# Patient Record
Sex: Male | Born: 1950
Health system: Southern US, Community
[De-identification: ages and names within clinical notes are randomized; demographics above are authoritative.]

## PROBLEM LIST (undated history)

## (undated) DIAGNOSIS — Z87442 Personal history of urinary calculi: Secondary | ICD-10-CM

## (undated) DIAGNOSIS — E7800 Pure hypercholesterolemia, unspecified: Secondary | ICD-10-CM

## (undated) DIAGNOSIS — N529 Male erectile dysfunction, unspecified: Secondary | ICD-10-CM

## (undated) DIAGNOSIS — I10 Essential (primary) hypertension: Secondary | ICD-10-CM

## (undated) DIAGNOSIS — R339 Retention of urine, unspecified: Secondary | ICD-10-CM

## (undated) HISTORY — DX: Male erectile dysfunction, unspecified: N52.9

## (undated) HISTORY — PX: NO PAST SURGERIES: SHX2092

## (undated) HISTORY — PX: TONSILLECTOMY: SUR1361

## (undated) HISTORY — PX: COLONOSCOPY: SHX174

## (undated) HISTORY — DX: Retention of urine, unspecified: R33.9

---

## 2014-02-17 DIAGNOSIS — M1712 Unilateral primary osteoarthritis, left knee: Secondary | ICD-10-CM | POA: Insufficient documentation

## 2014-02-17 DIAGNOSIS — M1711 Unilateral primary osteoarthritis, right knee: Secondary | ICD-10-CM | POA: Insufficient documentation

## 2014-02-25 DIAGNOSIS — I10 Essential (primary) hypertension: Secondary | ICD-10-CM | POA: Insufficient documentation

## 2017-07-09 ENCOUNTER — Encounter: Payer: Self-pay | Admitting: Emergency Medicine

## 2017-07-09 ENCOUNTER — Emergency Department
Admission: EM | Admit: 2017-07-09 | Discharge: 2017-07-09 | Disposition: A | Discharge status: Home / Self Care | Payer: Medicare Other | Attending: Emergency Medicine | Admitting: Emergency Medicine

## 2017-07-09 DIAGNOSIS — I10 Essential (primary) hypertension: Secondary | ICD-10-CM | POA: Insufficient documentation

## 2017-07-09 DIAGNOSIS — R310 Gross hematuria: Secondary | ICD-10-CM | POA: Diagnosis not present

## 2017-07-09 DIAGNOSIS — N3091 Cystitis, unspecified with hematuria: Secondary | ICD-10-CM | POA: Insufficient documentation

## 2017-07-09 DIAGNOSIS — N3001 Acute cystitis with hematuria: Secondary | ICD-10-CM

## 2017-07-09 DIAGNOSIS — R339 Retention of urine, unspecified: Secondary | ICD-10-CM | POA: Diagnosis present

## 2017-07-09 HISTORY — DX: Pure hypercholesterolemia, unspecified: E78.00

## 2017-07-09 HISTORY — DX: Essential (primary) hypertension: I10

## 2017-07-09 LAB — URINALYSIS, COMPLETE (UACMP) WITH MICROSCOPIC
Bilirubin Urine: NEGATIVE
Glucose, UA: NEGATIVE mg/dL
Ketones, ur: NEGATIVE mg/dL
Nitrite: POSITIVE — AB
Protein, ur: 100 mg/dL — AB
Specific Gravity, Urine: 1.014 (ref 1.005–1.030)
Squamous Epithelial / LPF: NONE SEEN
pH: 6 (ref 5.0–8.0)

## 2017-07-09 LAB — CBC WITH DIFFERENTIAL/PLATELET
Basophils Absolute: 0 10*3/uL (ref 0–0.1)
Basophils Relative: 0 %
Eosinophils Absolute: 0.1 10*3/uL (ref 0–0.7)
Eosinophils Relative: 0 %
HCT: 47.1 % (ref 40.0–52.0)
Hemoglobin: 16.1 g/dL (ref 13.0–18.0)
Lymphocytes Relative: 2 %
Lymphs Abs: 0.4 10*3/uL — ABNORMAL LOW (ref 1.0–3.6)
MCH: 29.3 pg (ref 26.0–34.0)
MCHC: 34.2 g/dL (ref 32.0–36.0)
MCV: 85.4 fL (ref 80.0–100.0)
Monocytes Absolute: 1 10*3/uL (ref 0.2–1.0)
Monocytes Relative: 6 %
Neutro Abs: 15.3 10*3/uL — ABNORMAL HIGH (ref 1.4–6.5)
Neutrophils Relative %: 92 %
Platelets: 210 10*3/uL (ref 150–440)
RBC: 5.52 MIL/uL (ref 4.40–5.90)
RDW: 14.2 % (ref 11.5–14.5)
WBC: 16.9 10*3/uL — ABNORMAL HIGH (ref 3.8–10.6)

## 2017-07-09 LAB — LACTIC ACID, PLASMA: Lactic Acid, Venous: 1.6 mmol/L (ref 0.5–1.9)

## 2017-07-09 LAB — BASIC METABOLIC PANEL
Anion gap: 16 — ABNORMAL HIGH (ref 5–15)
BUN: 14 mg/dL (ref 6–20)
CO2: 26 mmol/L (ref 22–32)
Calcium: 9.9 mg/dL (ref 8.9–10.3)
Chloride: 89 mmol/L — ABNORMAL LOW (ref 101–111)
Creatinine, Ser: 1.12 mg/dL (ref 0.61–1.24)
GFR calc Af Amer: >60 mL/min (ref >60)
GFR calc non Af Amer: >60 mL/min (ref >60)
Glucose, Bld: 141 mg/dL — ABNORMAL HIGH (ref 65–99)
Potassium: 3 mmol/L — ABNORMAL LOW (ref 3.5–5.1)
Sodium: 131 mmol/L — ABNORMAL LOW (ref 135–145)

## 2017-07-09 MED ORDER — TAMSULOSIN HCL 0.4 MG PO CAPS
0.4000 mg | ORAL_CAPSULE | Freq: Once | ORAL | Status: AC
  Administered 2017-07-09: 0.4 mg via ORAL
  Filled 2017-07-09: qty 1

## 2017-07-09 MED ORDER — TAMSULOSIN HCL 0.4 MG PO CAPS
ORAL_CAPSULE | ORAL | Status: AC
  Administered 2017-07-09: 0.4 mg via ORAL
  Filled 2017-07-09: qty 1

## 2017-07-09 MED ORDER — TAMSULOSIN HCL 0.4 MG PO CAPS: 0.4000 mg | ORAL_CAPSULE | Freq: Every day | ORAL | 0 refills | Status: DC

## 2017-07-09 MED ORDER — CEFTRIAXONE SODIUM IN DEXTROSE 20 MG/ML IV SOLN
1.0000 g | Freq: Once | INTRAVENOUS | Status: AC
  Administered 2017-07-09: 1 g via INTRAVENOUS
  Filled 2017-07-09: qty 50

## 2017-07-09 MED ORDER — SODIUM CHLORIDE 0.9 % IV BOLUS (SEPSIS)
500.0000 mL | Freq: Once | INTRAVENOUS | Status: AC
  Administered 2017-07-09: 500 mL via INTRAVENOUS

## 2017-07-09 MED ORDER — DEXTROSE 5 % IV SOLN: 1.0000 g | Freq: Once | INTRAVENOUS | Status: DC

## 2017-07-09 MED ORDER — CEPHALEXIN 500 MG PO CAPS: 500.0000 mg | ORAL_CAPSULE | Freq: Three times a day (TID) | ORAL | 0 refills | Status: AC

## 2017-07-09 NOTE — ED Notes (Signed)
Bladder scan completed. 969mL of urine un bladder. Minette Brine, RN notified about these findings.

## 2017-07-09 NOTE — ED Notes (Addendum)
Alert, oriented, ambulatory.   States has been on 2 rounds of antibiotics for inflammation around bladder. States was at PCP at Monday. PCP noticed blood in urine as well. Pt states has been very uncomfortable x few days. Was getting up to urinate every few minutes but unable to get much out. Was changed from cipro to doxycline this week. Pt also states fevers at home.

## 2017-07-09 NOTE — ED Provider Notes (Signed)
[facility] Emergency Department Provider Note  ____________________________________________  Time seen: Approximately 7:32 PM  I have reviewed the triage vital signs and the nursing notes.   HISTORY  Chief Complaint Urinary Retention   HPI Arthur Jones is a 66 y.o. male the history of hypertension and hyperlipidemia who presents for evaluation of urinary retention.Patient went to see his primary care doctor 2 days ago for urinary frequency, chills, low grade fever, nausea and vomiting. He was found to have a UTI. He was started on Cipro. Went back today because he was still having similar symptoms and his PCP switched him from Cipro to doxy since UA looked unchanged. This evening patient started to have urinary retention which he has never had before which prompted his visit to the ER. Patient initially had severe lower abdominal pain however that has resolved after Foley was placed in triage. Patient denies flank pain, chills, N/V or fever for the last 24 hours. Patient with hematuria in Foley bag.   Past Medical History:  Diagnosis Date  . High cholesterol   . Hypertension     There are no active problems to display for this patient.   History reviewed. No pertinent surgical history.  Prior to Admission medications   Medication Sig Start Date End Date Taking? Authorizing Provider  cephALEXin (KEFLEX) 500 MG capsule Take 1 capsule (500 mg total) 3 (three) times daily for 7 days by mouth. 07/09/17 07/16/17  Rudene Re, MD  tamsulosin (FLOMAX) 0.4 MG CAPS capsule Take 1 capsule (0.4 mg total) daily by mouth. 07/09/17   Rudene Re, MD    Allergies Patient has no known allergies.  No family history on file.  Social History Social History   Tobacco Use  . Smoking status: Never Smoker  . Smokeless tobacco: Never Used  Substance Use Topics  . Alcohol use: Yes    Comment: occasionally  . Drug use: No    Review of  Systems  Constitutional: Negative for fever. Eyes: Negative for visual changes. ENT: Negative for sore throat. Neck: No neck pain  Cardiovascular: Negative for chest pain. Respiratory: Negative for shortness of breath. Gastrointestinal: Negative for abdominal pain, vomiting or diarrhea. Genitourinary: + dysuria and frequency, + urinary retention Musculoskeletal: Negative for back pain. Skin: Negative for rash. Neurological: Negative for headaches, weakness or numbness. Psych: No SI or HI  ____________________________________________   PHYSICAL EXAM:  VITAL SIGNS: ED Triage Vitals  Enc Vitals Group     BP 07/09/17 1652 135/86     Pulse Rate 07/09/17 1652 (!) 124     Resp 07/09/17 1652 20     Temp 07/09/17 1652 98.7 F (37.1 C)     Temp Source 07/09/17 1652 Oral     SpO2 07/09/17 1652 96 %     Weight 07/09/17 1653 221 lb (100.2 kg)     Height 07/09/17 1653 5\' 11"  (1.803 m)     Head Circumference --      Peak Flow --      Pain Score 07/09/17 1716 0     Pain Loc --      Pain Edu? --      Excl. in [place]? --     Constitutional: Alert and oriented. Well appearing and in no apparent distress. HEENT:      Head: Normocephalic and atraumatic.         Eyes: Conjunctivae are normal. Sclera is non-icteric.       Mouth/Throat: Mucous membranes are moist.  Neck: Supple with no signs of meningismus. Cardiovascular: Tachycardic with regular rhythm. No murmurs, gallops, or rubs. 2+ symmetrical distal pulses are present in all extremities. No JVD. Respiratory: Normal respiratory effort. Lungs are clear to auscultation bilaterally. No wheezes, crackles, or rhonchi.  Gastrointestinal: Soft, non tender, and non distended with positive bowel sounds. No rebound or guarding. Genitourinary: No CVA tenderness. Musculoskeletal: Nontender with normal range of motion in all extremities. No edema, cyanosis, or erythema of extremities. Neurologic: Normal speech and language. Face is symmetric.  Moving all extremities. No gross focal neurologic deficits are appreciated. Skin: Skin is warm, dry and intact. No rash noted. Psychiatric: Mood and affect are normal. Speech and behavior are normal.  ____________________________________________   LABS (all labs ordered are listed, but only abnormal results are displayed)  Labs Reviewed  URINALYSIS, COMPLETE (UACMP) WITH MICROSCOPIC - Abnormal; Notable for the following components:      Result Value   Color, Urine AMBER (*)    APPearance CLOUDY (*)    Hgb urine dipstick LARGE (*)    Protein, ur 100 (*)    Nitrite POSITIVE (*)    Leukocytes, UA SMALL (*)    Bacteria, UA FEW (*)    All other components within normal limits  CBC WITH DIFFERENTIAL/PLATELET - Abnormal; Notable for the following components:   WBC 16.9 (*)    Neutro Abs 15.3 (*)    Lymphs Abs 0.4 (*)    All other components within normal limits  BASIC METABOLIC PANEL - Abnormal; Notable for the following components:   Sodium 131 (*)    Potassium 3.0 (*)    Chloride 89 (*)    Glucose, Bld 141 (*)    Anion gap 16 (*)    All other components within normal limits  URINE CULTURE  CULTURE, BLOOD (ROUTINE X 2)  CULTURE, BLOOD (ROUTINE X 2)  LACTIC ACID, PLASMA   ____________________________________________  EKG  none  ____________________________________________  RADIOLOGY  none  ____________________________________________   PROCEDURES  Procedure(s) performed: None Procedures Critical Care performed:  None ____________________________________________   INITIAL IMPRESSION / ASSESSMENT AND PLAN / ED COURSE   66 y.o. male the history of hypertension and hyperlipidemia who presents for evaluation of urinary retention in the setting of recent diagnosis of UTI. Patient with hematuria in Foley bag. Tachycardia which improved after Foley placement. No fever, normotensive. UA positive for UTI, culture will be sent. No culture done at PCP's. Will check labs to  rule out pyelo/ sepsis. Will give dose of rocephin. Will start patient on flomax.     _________________________ 10:10 PM on 07/09/2017 -----------------------------------------  Labs showing leukocytosis WBC 16.9, normal lactic acid and normal creatinine. Stable hgb. Patient was given IVF and IV rocephin. Urine and blood culture pending. Will dc home on flomax and keflex. Discussed admission for IV abx but patient wishes to go home. Therefore I recommended that he returns to the ER for abdominal pain, flank pain, fever, nausea or vomiting. Patient is extremely well appearing. F/u with PCP on Friday and Urology in one week for TOV. Foley care provided.    As part of my medical decision making, I reviewed the following data within the [place] History obtained from family, Nursing notes reviewed and incorporated, Labs reviewed , Old chart reviewed, Notes from prior ED visits and [place] Controlled Substance Database    Pertinent labs & imaging results that were available during my care of the patient were reviewed by me and considered in my  medical decision making (see chart for details).    ____________________________________________   FINAL CLINICAL IMPRESSION(S) / ED DIAGNOSES  Final diagnoses:  Urinary retention  Gross hematuria  Acute cystitis with hematuria      NEW MEDICATIONS STARTED DURING THIS VISIT:  This SmartLink is deprecated. Use AVSMEDLIST instead to display the medication list for a patient.   Note:  This document was prepared using Dragon voice recognition software and may include unintentional dictation errors.    Rudene Re, MD 07/09/17 2213

## 2017-07-09 NOTE — ED Triage Notes (Signed)
Patient presents to the ED with urinary retention since last night.  Patient states he has had dysuria and was diagnosed with a UTI on Monday and started on Cipro.  Patient's doctor changed him to doxycycline today.  Patient's urine is very bloody.  Urinary catheter placed in triage.  Patient states he had a UTI several years ago but has never seen a urologist.  Patient is in no obvious distress at this time.

## 2017-07-09 NOTE — Discharge Instructions (Signed)
RETURN TO THE ER IF YOU HAVE FEVER, CHILLS, ABDOMINAL PAIN, BACK PAIN, NAUSEA, VOMITING, OR IF YOU FEEL LIKE YOU ARE GOING TO PASS OUT. OTHERWISE FOLLOW UP WITH YOUR DOCTOR ON Friday. FOLLOW UP WITH UROLOGY IN ONE WEEK FOR REMOVAL OF THE FOLEY CATHETER. TAKE KEFLEX AS PRESCRIBED. STOP DOXYCYLINE. TAKE FLOMAX ONCE A DAY UNTIL FOLEY IS REMOVED.  Catheter care  Always wash your hands before and after touching your catheter.  Check the area around the urethra for inflammation or signs of infection. Signs of infection include irritated, swollen, red, or tender skin, or pus around the catheter.  Clean the area around the catheter with soap and water two times a day. Dry with a clean towel afterward.  Do not apply powder or lotion to the skin around the catheter.  To empty the urine collection bag  Wash your hands with soap and water.  Without touching the drain spout, remove the spout from its sleeve at the bottom of the collection bag. Open the valve on the spout.  Let the urine flow out of the bag and into the toilet or a container. Do not let the tubing or drain spout touch anything.  After you empty the bag, clean the end of the drain spout with tissue and water. Close the valve and put the drain spout back into its sleeve at the bottom of the collection bag.  Wash your hands with soap and water.

## 2017-07-12 LAB — URINE CULTURE: Culture: >=100000 — AB

## 2017-07-14 LAB — CULTURE, BLOOD (ROUTINE X 2)
Culture: NO GROWTH
Culture: NO GROWTH
Special Requests: ADEQUATE
Special Requests: ADEQUATE

## 2017-07-14 NOTE — Progress Notes (Signed)
07/15/2017 9:28 AM   Arthur Jones [birth year] [id number]  Referring provider: Derinda Late, MD [phone] [address] [facility] [place], [place] [address]  Chief Complaint  Patient presents with  . Urinary Retention    HPI: Patient is a 66 -year-old Caucasian male who presents today as a referral from [facility] ED for gross hematuria.    Patient was found to have gross hematuria on 07/09/2017 after a Foley was placed.  Patient initially went to the ED due to urinary retention.  Bladder scan noted > 900 mL in his urine.  Once Foley was placed, patient developed gross hematuria.  Patient had been on two antibiotics by his PCP for an UTI by his PCP prior to his visit to the ED. His first antibiotic was Cipro, but he did not improve.  His second antibiotic was doxycycline.  UA from November 5 was nitrate positive, 10-50 WBCs, 10-50 RBCs and many bacteria.  No culture was performed.  His second UA on November 7 was positive for 10-50 WBCs, 4-10 RBCs, moderate bacteria.  Urine culture at that time was positive for E. coli.    The E. coli was resistant to Cipro, but sensitive to the doxycycline.  In the ED, he was given Rocephin and discharged home on Keflex and Flomax.     He states that he had an UTI in May.  He was given an antibiotics and his symptoms went away.  He has a remote history of nephrolithiasis that he passed spontaneously.  He has a history of BPH, but he has not had malignancies of the genitourinary tract.   He does not have a family medical history of nephrolithiasis (maybe a sister), malignancies of the genitourinary tract or hematuria.   Today, he had a Foley in place.  Prior to the placement of the Foley, he was experiencing dysuria, intermittency and straining to urinate.  He is not experiencing any suprapubic pain, abdominal pain or flank pain. He denies any recent fevers, chills, nausea or vomiting.  He has not had any recent imaging  studies.   He is not a smoker.   He is not exposed to secondhand smoke.  He has not worked with Sports administrator, trichloroethylene, etc.   He has a high BMI.     PMH: Past Medical History:  Diagnosis Date  . Erectile dysfunction   . High cholesterol   . Hypertension   . Urinary retention     Surgical History: Past Surgical History:  Procedure Laterality Date  . NO PAST SURGERIES      Home Medications:  Allergies as of 07/15/2017   No Known Allergies     Medication List        Accurate as of 07/15/17  9:28 AM. Always use your most recent med list.          amLODipine 10 MG tablet Commonly known as:  NORVASC Take 10 mg daily by mouth.   aspirin EC 81 MG tablet Take 81 mg daily by mouth.   cephALEXin 500 MG capsule Commonly known as:  KEFLEX Take 1 capsule (500 mg total) 3 (three) times daily for 7 days by mouth.   finasteride 5 MG tablet Commonly known as:  PROSCAR Take 1 tablet (5 mg total) daily by mouth.   olmesartan-hydrochlorothiazide 40-25 MG tablet Commonly known as:  BENICAR HCT Take 1 tablet daily by mouth.   sildenafil 50 MG tablet Commonly known as:  VIAGRA Take  50 mg as needed by mouth for erectile dysfunction.   simvastatin 40 MG tablet Commonly known as:  ZOCOR Take 40 mg daily at 6 PM by mouth.   tamsulosin 0.4 MG Caps capsule Commonly known as:  FLOMAX Take 1 capsule (0.4 mg total) daily by mouth.       Allergies: No Known Allergies  Family History: Family History  Problem Relation Age of Onset  . Hypertension Father   . Hypercholesterolemia Father   . Hypertension Mother   . Hypercholesterolemia Mother     Social History:  reports that  has never smoked. he has never used smokeless tobacco. He reports that he drinks alcohol. He reports that he does not use drugs.  ROS: UROLOGY Frequent Urination?: No Hard to postpone urination?: No Burning/pain with urination?: Yes Get up at night to urinate?: No Leakage of  urine?: No Urine stream starts and stops?: No Trouble starting stream?: Yes Do you have to strain to urinate?: Yes Blood in urine?: Yes Urinary tract infection?: Yes Sexually transmitted disease?: No Injury to kidneys or bladder?: No Painful intercourse?: No Weak stream?: No Erection problems?: No Penile pain?: No  Gastrointestinal Nausea?: No Vomiting?: No Indigestion/heartburn?: No Diarrhea?: No Constipation?: No  Constitutional Fever: No Night sweats?: No Weight loss?: No Fatigue?: No  Skin Skin rash/lesions?: No Itching?: No  Eyes Blurred vision?: No Double vision?: No  Ears/Nose/Throat Sore throat?: No Sinus problems?: No  Hematologic/Lymphatic Swollen glands?: No Easy bruising?: No  Cardiovascular Leg swelling?: No Chest pain?: No  Respiratory Cough?: No Shortness of breath?: No  Endocrine Excessive thirst?: No  Musculoskeletal Back pain?: No Joint pain?: No  Neurological Headaches?: No Dizziness?: No  Psychologic Depression?: No Anxiety?: No  Physical Exam: BP 108/74   Pulse 89   Ht 5\' 11"  (1.803 m)   Wt 215 lb 3.2 oz (97.6 kg)   BMI 30.01 kg/m   Constitutional: Well nourished. Alert and oriented, No acute distress. HEENT: [place] AT, moist mucus membranes. Trachea midline, no masses. Cardiovascular: No clubbing, cyanosis, or edema. Respiratory: Normal respiratory effort, no increased work of breathing. GI: Abdomen is soft, non tender, non distended, no abdominal masses. Liver and spleen not palpable.  No hernias appreciated.  Stool sample for occult testing is not indicated.   GU: No CVA tenderness.  No bladder fullness or masses.  Patient with circumcised phallus.   Urethral meatus is patent.  No penile discharge. No penile lesions or rashes. Scrotum without lesions, cysts, rashes and/or edema.  Testicles are located scrotally bilaterally. No masses are appreciated in the testicles. Left and right epididymis are normal. Rectal:  Patient with  normal sphincter tone. Anus and perineum without scarring or rashes. No rectal masses are appreciated. Prostate is approximately 60 grams, no nodules are appreciated. Seminal vesicles are normal. Skin: No rashes, bruises or suspicious lesions. Lymph: No cervical or inguinal adenopathy. Neurologic: Grossly intact, no focal deficits, moving all 4 extremities. Psychiatric: Normal mood and affect.  Laboratory Data: PSA History   1.17 in January 2016  1.01 in March 2017  1.16 in April 2018 Lab Results  Component Value Date   WBC 16.9 (H) 07/09/2017   HGB 16.1 07/09/2017   HCT 47.1 07/09/2017   MCV 85.4 07/09/2017   PLT 210 07/09/2017    Lab Results  Component Value Date   CREATININE 1.12 07/09/2017     Urinalysis    Component Value Date/Time   COLORURINE AMBER (A) 07/09/2017 1709   APPEARANCEUR CLOUDY (A) 07/09/2017 1709  LABSPEC 1.014 07/09/2017 1709   PHURINE 6.0 07/09/2017 1709   GLUCOSEU NEGATIVE 07/09/2017 1709   HGBUR LARGE (A) 07/09/2017 1709   BILIRUBINUR NEGATIVE 07/09/2017 1709   KETONESUR NEGATIVE 07/09/2017 1709   PROTEINUR 100 (A) 07/09/2017 1709   NITRITE POSITIVE (A) 07/09/2017 1709   LEUKOCYTESUR SMALL (A) 07/09/2017 1709   I have reviewed the labs.   Assessment & Plan:    1. Gross hematuria  - I explained to the patient that there are a number of causes that can be associated with blood in the urine, such as stones, BPH, UTI's, damage to the urinary tract and/or cancer -his recent episode of gross hematuria is most likely due to the results of urinary tract infection and urinary retention we will continue to monitor once these issues are resolved to see if the hematuria persists  -If the hematuria persists, the patient will warrant further urologic evaluation.   The AUA guidelines state that a CT urogram is the preferred imaging study and a cystoscopy is indicated to evaluate hematuria.   2. Acute urinary retention:     - foley catheter  removed for TOV  -continue the tamsulosin 0.4 mg daily; refills given    - add finasteride 5 mg daily; side effects discussed; prescription given  -return if unable to urinate or experiencing suprapubic discomfort  -follow-up in one month for I PSS score, PVR and exam.  3.  UTI with hematuria  - Patient will continue the Keflex  - Advised to contact our office or seek treatment in the ED if becomes febrile or pain/ vomiting are difficult control in order to arrange for emergent/urgent intervention  4. BPH with LU TS  - see above   Return in about 1 month (around 08/14/2017) for IPSS and PVR.  These notes generated with voice recognition software. I apologize for typographical errors.  Zara Council, [facility] [address] [place], [place] [address] [phone]

## 2017-07-15 ENCOUNTER — Ambulatory Visit: Payer: Medicare Other | Admitting: Urology

## 2017-07-15 ENCOUNTER — Encounter: Payer: Self-pay | Admitting: Urology

## 2017-07-15 VITALS — BP 108/74 | HR 89 | Ht 71.0 in | Wt 215.2 lb

## 2017-07-15 DIAGNOSIS — R338 Other retention of urine: Secondary | ICD-10-CM

## 2017-07-15 DIAGNOSIS — R310 Gross hematuria: Secondary | ICD-10-CM | POA: Diagnosis not present

## 2017-07-15 DIAGNOSIS — N401 Enlarged prostate with lower urinary tract symptoms: Secondary | ICD-10-CM

## 2017-07-15 DIAGNOSIS — N529 Male erectile dysfunction, unspecified: Secondary | ICD-10-CM | POA: Insufficient documentation

## 2017-07-15 DIAGNOSIS — N138 Other obstructive and reflux uropathy: Secondary | ICD-10-CM

## 2017-07-15 DIAGNOSIS — N3001 Acute cystitis with hematuria: Secondary | ICD-10-CM

## 2017-07-15 MED ORDER — FINASTERIDE 5 MG PO TABS: 5.0000 mg | ORAL_TABLET | Freq: Every day | ORAL | 3 refills | Status: DC

## 2017-07-15 MED ORDER — TAMSULOSIN HCL 0.4 MG PO CAPS: 0.4000 mg | ORAL_CAPSULE | Freq: Every day | ORAL | 3 refills | Status: DC

## 2017-07-15 NOTE — Progress Notes (Signed)
Catheter Removal  Patient is present today for a catheter removal.  75ml of water was drained from the balloon. A 16FR foley cath was removed from the bladder no complications were noted . Patient tolerated well.  Performed by: Reece Packer, RN

## 2017-08-11 NOTE — Progress Notes (Deleted)
08/12/2017 10:36 AM   Arthur Jones Mar 24, 1951 [id number]  Referring provider: Derinda Late, MD [phone] [facility] [place], [place] [address]  No chief complaint on file.   HPI: 66 yo WM with gross hematuria associated with an UTI, urinary retention and BPH with LU TS who presents for a one month follow up.  Background history Patient is a 53 -year-old Caucasian male who presents today as [name] for gross hematuria.  Patient was found to have gross hematuria on 07/09/2017 after a Foley was placed.  Patient initially went to the ED due to urinary retention.  Bladder scan noted > 900 mL in his urine.  Once Foley was placed, patient developed gross hematuria.  Patient had been on two antibiotics by his PCP for an UTI by his PCP prior to his visit to the ED. His first antibiotic was Cipro, but he did not improve.  His second antibiotic was doxycycline.  UA from November 5 was nitrate positive, 10-50 WBCs, 10-50 RBCs and many bacteria.  No culture was performed.  His second UA on November 7 was positive for 10-50 WBCs, 4-10 RBCs, moderate bacteria.  Urine culture at that time was positive for E. coli.    The E. coli was resistant to Cipro, but sensitive to the doxycycline.  In the ED, he was given Rocephin and discharged home on Keflex and Flomax.   He states that he had an UTI in May.  He was given an antibiotics and his symptoms went away.  He has a remote history of nephrolithiasis that he passed spontaneously.  He has a history of BPH, but he has not had malignancies of the genitourinary tract.  He does not have a family medical history of nephrolithiasis (maybe a sister), malignancies of the genitourinary tract or hematuria.     At his visit on 07/15/2017, he had a Foley in place.  Prior to the placement of the Foley, he was experiencing dysuria, intermittency and straining to urinate.  He was not experiencing any suprapubic  pain, abdominal pain or flank pain. He denied any recent fevers, chills, nausea or vomiting.  He has not had any recent imaging studies.  He is not a smoker.   He is not exposed to secondhand smoke.  He has not worked with Sports administrator, trichloroethylene, etc.   He has a high BMI.  His Foley was removed for a TOV.  He was instructed to continue the tamsulosin 0.4 mg and finasteride 5 mg was added.    His IPSS score today is ***, which is *** lower urinary tract symptomatology. He is *** with his quality life due to his urinary symptoms. His PVR is *** mL.  His UA today is ***.    His major complaint today ***.  He has had these symptoms for *** years.  He denies any dysuria, hematuria or suprapubic pain.   He currently taking ***.  His has had ***.    He also denies any recent fevers, chills, nausea or vomiting.  He does not have a family history of PCa.    Score:  1-7 Mild 8-19 Moderate 20-35 Severe    PMH: Past Medical History:  Diagnosis Date  . Erectile dysfunction   . High cholesterol   . Hypertension   . Urinary retention     Surgical History: Past Surgical History:  Procedure Laterality Date  . NO PAST SURGERIES  Home Medications:  Allergies as of 08/12/2017   No Known Allergies     Medication List        Accurate as of 08/11/17 10:36 AM. Always use your most recent med list.          amLODipine 10 MG tablet Commonly known as:  NORVASC Take 10 mg daily by mouth.   aspirin EC 81 MG tablet Take 81 mg daily by mouth.   finasteride 5 MG tablet Commonly known as:  PROSCAR Take 1 tablet (5 mg total) daily by mouth.   olmesartan-hydrochlorothiazide 40-25 MG tablet Commonly known as:  BENICAR HCT Take 1 tablet daily by mouth.   sildenafil 50 MG tablet Commonly known as:  VIAGRA Take 50 mg as needed by mouth for erectile dysfunction.   simvastatin 40 MG tablet Commonly known as:  ZOCOR Take 40 mg daily at 6 PM by mouth.   tamsulosin  0.4 MG Caps capsule Commonly known as:  FLOMAX Take 1 capsule (0.4 mg total) daily by mouth.       Allergies: No Known Allergies  Family History: Family History  Problem Relation Age of Onset  . Hypertension Father   . Hypercholesterolemia Father   . Hypertension Mother   . Hypercholesterolemia Mother     Social History:  reports that  has never smoked. he has never used smokeless tobacco. He reports that he drinks alcohol. He reports that he does not use drugs.  ROS:                                        Physical Exam: There were no vitals taken for this visit.  Constitutional: Well nourished. Alert and oriented, No acute distress. HEENT: [place] AT, moist mucus membranes. Trachea midline, no masses. Cardiovascular: No clubbing, cyanosis, or edema. Respiratory: Normal respiratory effort, no increased work of breathing. Skin: No rashes, bruises or suspicious lesions. Lymph: No cervical or inguinal adenopathy. Neurologic: Grossly intact, no focal deficits, moving all 4 extremities. Psychiatric: Normal mood and affect.  Laboratory Data: PSA History   1.17 in January 2016  1.01 in March 2017  1.16 in April 2018 Lab Results  Component Value Date   WBC 16.9 (H) 07/09/2017   HGB 16.1 07/09/2017   HCT 47.1 07/09/2017   MCV 85.4 07/09/2017   PLT 210 07/09/2017    Lab Results  Component Value Date   CREATININE 1.12 07/09/2017     Urinalysis *** I have reviewed the labs.   Assessment & Plan:    1. Gross hematuria  - I explained to the patient that there are a number of causes that can be associated with blood in the urine, such as stones, BPH, UTI's, damage to the urinary tract and/or cancer -his recent episode of gross hematuria is most likely due to the results of urinary tract infection and urinary retention we will continue to monitor once these issues are resolved to see if the hematuria persists  -If the hematuria persists, the patient  will warrant further urologic evaluation.   The AUA guidelines state that a CT urogram is the preferred imaging study and a cystoscopy is indicated to evaluate hematuria.   2. Acute urinary retention:     - foley catheter removed for TOV  -continue the tamsulosin 0.4 mg daily; refills given    - add finasteride 5 mg daily; side effects discussed; prescription given  -return  if unable to urinate or experiencing suprapubic discomfort  -follow-up in one month for I PSS score, PVR and exam.  3.  UTI with hematuria  - Patient will continue the Keflex  - Advised to contact our office or seek treatment in the ED if becomes febrile or pain/ vomiting are difficult control in order to arrange for emergent/urgent intervention  4. BPH with LU TS  - see above   No Follow-up on file.  These notes generated with voice recognition software. I apologize for typographical errors.  Zara Council, [facility] [address] [place], [place] [address] [phone]

## 2017-08-12 ENCOUNTER — Ambulatory Visit: Payer: Medicare Other | Admitting: Urology

## 2017-08-12 ENCOUNTER — Encounter: Payer: Self-pay | Admitting: Urology

## 2017-08-12 VITALS — BP 159/95 | HR 88 | Ht 71.0 in | Wt 220.0 lb

## 2017-08-12 DIAGNOSIS — N3001 Acute cystitis with hematuria: Secondary | ICD-10-CM | POA: Diagnosis not present

## 2017-08-12 DIAGNOSIS — N138 Other obstructive and reflux uropathy: Secondary | ICD-10-CM | POA: Diagnosis not present

## 2017-08-12 DIAGNOSIS — R310 Gross hematuria: Secondary | ICD-10-CM | POA: Diagnosis not present

## 2017-08-12 DIAGNOSIS — R339 Retention of urine, unspecified: Secondary | ICD-10-CM

## 2017-08-12 DIAGNOSIS — N401 Enlarged prostate with lower urinary tract symptoms: Secondary | ICD-10-CM | POA: Diagnosis not present

## 2017-08-12 LAB — URINALYSIS, COMPLETE
Bilirubin, UA: NEGATIVE
Glucose, UA: NEGATIVE
Ketones, UA: NEGATIVE
Nitrite, UA: POSITIVE — AB
Specific Gravity, UA: 1.025 (ref 1.005–1.030)
Urobilinogen, Ur: 0.2 mg/dL (ref 0.2–1.0)
pH, UA: 6 (ref 5.0–7.5)

## 2017-08-12 LAB — MICROSCOPIC EXAMINATION
Epithelial Cells (non renal): NONE SEEN /hpf (ref 0–10)
WBC, UA: >30 /hpf — ABNORMAL HIGH (ref 0–?)

## 2017-08-12 LAB — BLADDER SCAN AMB NON-IMAGING: Scan Result: 289

## 2017-08-12 NOTE — Progress Notes (Signed)
08/12/2017 11:11 AM   Arthur Jones 1950/10/04 [id number]  Referring provider: Derinda Late, MD [phone] [address] [facility] [place] [facility] [place], [place] [address]  Chief Complaint  Patient presents with  . Urinary Retention  . Hematuria    HPI: 66 yo WM with gross hematuria associated with an UTI, urinary retention and BPH with LU TS who presents for a one month follow up.  Background history Patient is a 80 -year-old Caucasian male who presents today as a referral from [facility] ED for gross hematuria.  Patient was found to have gross hematuria on 07/09/2017 after a Foley was placed.  Patient initially went to the ED due to urinary retention.  Bladder scan noted > 900 mL in his urine.  Once Foley was placed, patient developed gross hematuria.  Patient had been on two antibiotics by his PCP for an UTI by his PCP prior to his visit to the ED. His first antibiotic was Cipro, but he did not improve.  His second antibiotic was doxycycline.  UA from November 5 was nitrate positive, 10-50 WBCs, 10-50 RBCs and many bacteria.  No culture was performed.  His second UA on November 7 was positive for 10-50 WBCs, 4-10 RBCs, moderate bacteria.  Urine culture at that time was positive for E. coli.    The E. coli was resistant to Cipro, but sensitive to the doxycycline.  In the ED, he was given Rocephin and discharged home on Keflex and Flomax.   He states that he had an UTI in May.  He was given an antibiotics and his symptoms went away.  He has a remote history of nephrolithiasis that he passed spontaneously.  He has a history of BPH, but he has not had malignancies of the genitourinary tract.  He does not have a family medical history of nephrolithiasis (maybe a sister), malignancies of the genitourinary tract or hematuria.     At his visit on 07/15/2017, he had a Foley in place.  Prior to the placement of the Foley, he was experiencing dysuria, intermittency and straining to  urinate.  He was not experiencing any suprapubic pain, abdominal pain or flank pain. He denied any recent fevers, chills, nausea or vomiting.  He has not had any recent imaging studies.  He is not a smoker.   He is not exposed to secondhand smoke.  He has not worked with Sports administrator, trichloroethylene, etc.   He has a high BMI.  His Foley was removed for a TOV.  He was instructed to continue the tamsulosin 0.4 mg and finasteride 5 mg was added.    His IPSS score today is 4, which is mild lower urinary tract symptomatology. He is mostly satisfied with his quality life due to his urinary symptoms. His PVR is 289 mL.  His UA today is positive for > 30 WBC's, 3-10 RBC's, many bacteria and nitrite positive.    He has no complaints today.   He denies any dysuria, hematuria or suprapubic pain.   He currently taking tamsulosin 0.4 mg and finasteride 5 mg daily.    He also denies any recent fevers, chills, nausea or vomiting.  He does not have a family history of PCa.  IPSS    Row Name 08/12/17 1000         International Prostate Symptom Score   How often have you had the sensation of not emptying your bladder?  Less than 1 in 5  How often have you had to urinate less than every two hours?  Less than 1 in 5 times     How often have you found you stopped and started again several times when you urinated?  Not at All     How often have you found it difficult to postpone urination?  Not at All     How often have you had a weak urinary stream?  Not at All     How often have you had to strain to start urination?  Not at All     How many times did you typically get up at night to urinate?  2 Times     Total IPSS Score  4       Quality of Life due to urinary symptoms   If you were to spend the rest of your life with your urinary condition just the way it is now how would you feel about that?  Mostly Satisfied        Score:  1-7 Mild 8-19 Moderate 20-35 Severe    PMH: Past Medical  History:  Diagnosis Date  . Erectile dysfunction   . High cholesterol   . Hypertension   . Urinary retention     Surgical History: Past Surgical History:  Procedure Laterality Date  . NO PAST SURGERIES      Home Medications:  Allergies as of 08/12/2017   No Known Allergies     Medication List        Accurate as of 08/12/17 11:11 AM. Always use your most recent med list.          amLODipine 10 MG tablet Commonly known as:  NORVASC Take 10 mg daily by mouth.   aspirin EC 81 MG tablet Take 81 mg daily by mouth.   finasteride 5 MG tablet Commonly known as:  PROSCAR Take 1 tablet (5 mg total) daily by mouth.   olmesartan-hydrochlorothiazide 40-25 MG tablet Commonly known as:  BENICAR HCT Take 1 tablet daily by mouth.   sildenafil 50 MG tablet Commonly known as:  VIAGRA Take 50 mg as needed by mouth for erectile dysfunction.   simvastatin 40 MG tablet Commonly known as:  ZOCOR Take 40 mg daily at 6 PM by mouth.   tamsulosin 0.4 MG Caps capsule Commonly known as:  FLOMAX Take 1 capsule (0.4 mg total) daily by mouth.       Allergies: No Known Allergies  Family History: Family History  Problem Relation Age of Onset  . Hypertension Father   . Hypercholesterolemia Father   . Hypertension Mother   . Hypercholesterolemia Mother     Social History:  reports that  has never smoked. he has never used smokeless tobacco. He reports that he drinks alcohol. He reports that he does not use drugs.  ROS: UROLOGY Frequent Urination?: No Hard to postpone urination?: No Burning/pain with urination?: No Get up at night to urinate?: No Leakage of urine?: No Urine stream starts and stops?: No Trouble starting stream?: No Do you have to strain to urinate?: No Blood in urine?: No Urinary tract infection?: No Sexually transmitted disease?: No Injury to kidneys or bladder?: No Painful intercourse?: No Weak stream?: No Erection problems?: No Penile pain?:  No  Gastrointestinal Nausea?: No Vomiting?: No Indigestion/heartburn?: No Diarrhea?: No Constipation?: No  Constitutional Fever: No Night sweats?: No Weight loss?: No Fatigue?: No  Skin Skin rash/lesions?: No Itching?: No  Eyes Blurred vision?: No Double vision?: No  Ears/Nose/Throat Sore throat?:  No Sinus problems?: No  Hematologic/Lymphatic Swollen glands?: No Easy bruising?: No  Cardiovascular Leg swelling?: No Chest pain?: No  Respiratory Cough?: No Shortness of breath?: No  Endocrine Excessive thirst?: No  Musculoskeletal Back pain?: No Joint pain?: No  Neurological Headaches?: No Dizziness?: No  Psychologic Depression?: No Anxiety?: No  Physical Exam: BP (!) 159/95   Pulse 88   Ht 5\' 11"  (1.803 m)   Wt 220 lb (99.8 kg)   BMI 30.68 kg/m   Constitutional: Well nourished. Alert and oriented, No acute distress. HEENT: [place] AT, moist mucus membranes. Trachea midline, no masses. Cardiovascular: No clubbing, cyanosis, or edema. Respiratory: Normal respiratory effort, no increased work of breathing. Skin: No rashes, bruises or suspicious lesions. Lymph: No cervical or inguinal adenopathy. Neurologic: Grossly intact, no focal deficits, moving all 4 extremities. Psychiatric: Normal mood and affect.  Laboratory Data: PSA History   1.17 in January 2016  1.01 in March 2017  1.16 in April 2018 Lab Results  Component Value Date   WBC 16.9 (H) 07/09/2017   HGB 16.1 07/09/2017   HCT 47.1 07/09/2017   MCV 85.4 07/09/2017   PLT 210 07/09/2017    Lab Results  Component Value Date   CREATININE 1.12 07/09/2017     Urinalysis > 30 WBC's, 3-10 RBC's, many bacteria and nitrite positive.   See Epic.   I have reviewed the labs.   Assessment & Plan:    1. Gross hematuria  - I explained to the patient that there are a number of causes that can be associated with blood in the urine, such as stones, BPH, UTI's, damage to the urinary tract  and/or cancer -his recent episode of gross hematuria is most likely due to the results of urinary tract infection and urinary retention we will continue to monitor once these issues are resolved to see if the hematuria persists  -If the hematuria persists, the patient will warrant further urologic evaluation.   The AUA guidelines state that a CT urogram is the preferred imaging study and a cystoscopy is indicated to evaluate hematuria.  - UA is positive for hematuria - but it is also suspicious for infection   2. History of urinary retention:     -continue the tamsulosin 0.4 mg daily and finasteride 5 mg daily  -return if unable to urinate or experiencing suprapubic discomfort  -follow-up in three month for I PSS score, PVR and exam.  3.  UTI with hematuria  - UA is still suspicious for UTI - patient asymptomatic - will send for culture - will not prescribe an antibiotic at this time - wait on ucx results  - Advised to contact our office or seek treatment in the ED if becomes febrile or pain/ vomiting are difficult control in order to arrange for emergent/urgent intervention  4. BPH with LU TS  - see above   Return in about 3 months (around 11/10/2017) for IPSS, PVR and exam.  These notes generated with voice recognition software. I apologize for typographical errors.  Zara Council, [facility] [address] [place], [place] [address] [phone]

## 2017-08-15 LAB — CULTURE, URINE COMPREHENSIVE

## 2017-08-18 ENCOUNTER — Telehealth: Payer: Self-pay

## 2017-08-18 MED ORDER — AMOXICILLIN-POT CLAVULANATE 875-125 MG PO TABS: 1.0000 | ORAL_TABLET | Freq: Two times a day (BID) | ORAL | 0 refills | Status: AC

## 2017-08-18 NOTE — Telephone Encounter (Signed)
Spoke with pt in reference to +ucx, abx and hematuria. Pt voiced understanding. Abx sent to pharmacy.

## 2017-08-18 NOTE — Telephone Encounter (Signed)
-----   Message from Nori Riis, PA-C sent at 08/17/2017  4:44 PM EST ----- Please let [name] know that his urine culture was positive.  We need to start Augmentin 875/125, one tablet twice daily x seven days.  We then need to recheck his urine to make sure the blood is no longer present.

## 2017-11-10 ENCOUNTER — Ambulatory Visit: Payer: Medicare Other | Admitting: Urology

## 2017-11-11 ENCOUNTER — Ambulatory Visit: Payer: Medicare Other | Admitting: Urology

## 2017-11-11 ENCOUNTER — Encounter: Payer: Self-pay | Admitting: Urology

## 2017-11-11 VITALS — BP 147/95 | HR 79 | Resp 16 | Ht 71.0 in | Wt 221.0 lb

## 2017-11-11 DIAGNOSIS — Z87898 Personal history of other specified conditions: Secondary | ICD-10-CM | POA: Diagnosis not present

## 2017-11-11 DIAGNOSIS — Z87448 Personal history of other diseases of urinary system: Secondary | ICD-10-CM

## 2017-11-11 DIAGNOSIS — N401 Enlarged prostate with lower urinary tract symptoms: Secondary | ICD-10-CM

## 2017-11-11 DIAGNOSIS — N138 Other obstructive and reflux uropathy: Secondary | ICD-10-CM

## 2017-11-11 LAB — URINALYSIS, COMPLETE
Bilirubin, UA: NEGATIVE
Glucose, UA: NEGATIVE
Ketones, UA: NEGATIVE
Leukocytes, UA: NEGATIVE
Nitrite, UA: NEGATIVE
Protein, UA: NEGATIVE
RBC, UA: NEGATIVE
Specific Gravity, UA: 1.02 (ref 1.005–1.030)
Urobilinogen, Ur: 0.2 mg/dL (ref 0.2–1.0)
pH, UA: 6.5 (ref 5.0–7.5)

## 2017-11-11 NOTE — Progress Notes (Signed)
11/11/2017 11:02 PM   Arthur Jones 08-09-1951 [id number]  Referring provider: Derinda Late, MD [phone] [address] [facility] [place], [place] [address]  No chief complaint on file.   HPI: 67 yo WM with a history of gross hematuria associated with an UTI, urinary retention and BPH with LU TS who presents for a three month follow up.  Background history Patient is a 67 -year-old Caucasian male who presents today as a referral from [facility] ED for gross hematuria.  Patient was found to have gross hematuria on 07/09/2017 after a Foley was placed.  Patient initially went to the ED due to urinary retention.  Bladder scan noted > 900 mL in his urine.  Once Foley was placed, patient developed gross hematuria.  Patient had been on two antibiotics by his PCP for an UTI by his PCP prior to his visit to the ED. His first antibiotic was Cipro, but he did not improve.  His second antibiotic was doxycycline.  UA from November 5 was nitrate positive, 10-50 WBCs, 10-50 RBCs and many bacteria.  No culture was performed.  His second UA on November 7 was positive for 10-50 WBCs, 4-10 RBCs, moderate bacteria.  Urine culture at that time was positive for E. coli.    The E. coli was resistant to Cipro, but sensitive to the doxycycline.  In the ED, he was given Rocephin and discharged home on Keflex and Flomax.   He states that he had an UTI in May.  He was given an antibiotics and his symptoms went away.  He has a remote history of nephrolithiasis that he passed spontaneously.  He has a history of BPH, but he has not had malignancies of the genitourinary tract.  He does not have a family medical history of nephrolithiasis (maybe a sister), malignancies of the genitourinary tract or hematuria.    At his visit on 07/15/2017, he had a Foley in place.  Prior to the placement of the Foley, he was experiencing dysuria, intermittency and straining to urinate.  He was not experiencing  any suprapubic pain, abdominal pain or flank pain. He denied any recent fevers, chills, nausea or vomiting.  He has not had any recent imaging studies.  He is not a smoker.   He is not exposed to secondhand smoke.  He has not worked with Sports administrator, trichloroethylene, etc.   He has a high BMI.  His Foley was removed for a TOV.  He was instructed to continue the tamsulosin 0.4 mg and finasteride 5 mg was added.    His IPSS score today is 1, which is mild lower urinary tract symptomatology. He is pleased with his quality life due to his urinary symptoms.   His previous I PSS score was 4/2.  His previous PVR was 289 mL.  His UA today is negative.    He has no complaints today.   He denies any dysuria, hematuria or suprapubic pain.   He currently taking tamsulosin 0.4 mg and finasteride 5 mg daily.    He also denies any recent fevers, chills, nausea or vomiting.  He does not have a family history of PCa.  IPSS    Row Name 11/11/17 0900         International Prostate Symptom Score   How often have you had the sensation of not emptying your bladder?  Not at All     How often have you had to urinate less than  every two hours?  Not at All     How often have you found you stopped and started again several times when you urinated?  Not at All     How often have you found it difficult to postpone urination?  Not at All     How often have you had a weak urinary stream?  Not at All     How often have you had to strain to start urination?  Not at All     How many times did you typically get up at night to urinate?  1 Time     Total IPSS Score  1       Quality of Life due to urinary symptoms   If you were to spend the rest of your life with your urinary condition just the way it is now how would you feel about that?  Pleased        Score:  1-7 Mild 8-19 Moderate 20-35 Severe    PMH: Past Medical History:  Diagnosis Date  . Erectile dysfunction   . High cholesterol   .  Hypertension   . Urinary retention     Surgical History: Past Surgical History:  Procedure Laterality Date  . NO PAST SURGERIES      Home Medications:  Allergies as of 11/11/2017   No Known Allergies     Medication List        Accurate as of 11/11/17 11:59 PM. Always use your most recent med list.          amLODipine 10 MG tablet Commonly known as:  NORVASC Take 10 mg daily by mouth.   aspirin EC 81 MG tablet Take 81 mg daily by mouth.   finasteride 5 MG tablet Commonly known as:  PROSCAR Take 1 tablet (5 mg total) daily by mouth.   olmesartan-hydrochlorothiazide 40-25 MG tablet Commonly known as:  BENICAR HCT Take 1 tablet daily by mouth.   sildenafil 50 MG tablet Commonly known as:  VIAGRA Take 50 mg as needed by mouth for erectile dysfunction.   simvastatin 40 MG tablet Commonly known as:  ZOCOR Take 40 mg daily at 6 PM by mouth.   tamsulosin 0.4 MG Caps capsule Commonly known as:  FLOMAX Take 1 capsule (0.4 mg total) daily by mouth.       Allergies: No Known Allergies  Family History: Family History  Problem Relation Age of Onset  . Hypertension Father   . Hypercholesterolemia Father   . Hypertension Mother   . Hypercholesterolemia Mother     Social History:  reports that he has never smoked. He has never used smokeless tobacco. He reports that he drinks alcohol. He reports that he does not use drugs.  ROS: UROLOGY Frequent Urination?: No Hard to postpone urination?: No Burning/pain with urination?: No Get up at night to urinate?: No Leakage of urine?: No Urine stream starts and stops?: No Trouble starting stream?: No Do you have to strain to urinate?: No Blood in urine?: No Urinary tract infection?: No Sexually transmitted disease?: No Injury to kidneys or bladder?: No Painful intercourse?: No Weak stream?: No Erection problems?: No Penile pain?: No  Gastrointestinal Nausea?: No Vomiting?: No Indigestion/heartburn?:  No Diarrhea?: No Constipation?: No  Constitutional Fever: No Night sweats?: No Weight loss?: No Fatigue?: No  Skin Skin rash/lesions?: No Itching?: No  Eyes Blurred vision?: No Double vision?: No  Ears/Nose/Throat Sore throat?: No Sinus problems?: No  Hematologic/Lymphatic Swollen glands?: No Easy bruising?: No  Cardiovascular Leg swelling?: No Chest pain?: No  Respiratory Cough?: No Shortness of breath?: No  Endocrine Excessive thirst?: No  Musculoskeletal Back pain?: No Joint pain?: No  Neurological Headaches?: No Dizziness?: No  Psychologic Depression?: No Anxiety?: No  Physical Exam: BP (!) 147/95   Pulse 79   Resp 16   Ht 5\' 11"  (1.803 m)   Wt 221 lb (100.2 kg)   SpO2 98%   BMI 30.82 kg/m   Constitutional: Well nourished. Alert and oriented, No acute distress. HEENT: [place] AT, moist mucus membranes. Trachea midline, no masses. Cardiovascular: No clubbing, cyanosis, or edema. Respiratory: Normal respiratory effort, no increased work of breathing. GI: Abdomen is soft, non tender, non distended, no abdominal masses. Liver and spleen not palpable.  No hernias appreciated.  Stool sample for occult testing is not indicated.   GU: No CVA tenderness.  No bladder fullness or masses.  Patient with circumcised phallus.   Urethral meatus is patent.  No penile discharge. No penile lesions or rashes. Scrotum without lesions, cysts, rashes and/or edema.  Testicles are located scrotally bilaterally. No masses are appreciated in the testicles. Left and right epididymis are normal. Rectal: Patient with  normal sphincter tone. Anus and perineum without scarring or rashes. No rectal masses are appreciated. Prostate is approximately 60 grams, no nodules are appreciated. Seminal vesicles are normal. Skin: No rashes, bruises or suspicious lesions. Lymph: No cervical or inguinal adenopathy. Neurologic: Grossly intact, no focal deficits, moving all 4  extremities. Psychiatric: Normal mood and affect.   Laboratory Data: PSA History   1.17 in January 2016  1.01 in March 2017  1.16 in April 2018 Lab Results  Component Value Date   WBC 16.9 (H) 07/09/2017   HGB 16.1 07/09/2017   HCT 47.1 07/09/2017   MCV 85.4 07/09/2017   PLT 210 07/09/2017    Lab Results  Component Value Date   CREATININE 1.12 07/09/2017     Urinalysis  Negative.  See Epic.   I have reviewed the labs.   Assessment & Plan:    1. History of hematuria  - Associated with infection  - no reports of gross hematuria  - UA is negative  - RTC in one year for UA   2. History of urinary retention:     -continue the tamsulosin 0.4 mg daily and finasteride 5 mg daily  -return if unable to urinate or experiencing suprapubic discomfort  -follow-up in twelve month for I PSS score, PVR and exam.  3. BPH with LU TS  - IPSS score is 1/1, it is improving  - Continue conservative management, avoiding bladder irritants and timed voiding's  - Continue tamsulosin 0.4 mg daily and finasteride 5 mg daily; refills given  - RTC in 12 months for IPSS, PSA and exam   Return in about 1 year (around 11/12/2018) for I PSS and exam.  These notes generated with voice recognition software. I apologize for typographical errors.  Zara Council, [facility] [address] [place],  [address] [phone]

## 2017-12-08 ENCOUNTER — Ambulatory Visit: Payer: Medicare Other | Admitting: Urology

## 2017-12-10 NOTE — Progress Notes (Signed)
12/11/2017 2:49 PM   Arthur Jones 1950-10-30 [id number]  Referring provider: Derinda Late, MD [phone] [address] [facility] [place], [place] [address]  Chief Complaint  Patient presents with  . Urinary Frequency    HPI: 67 yo WM with a history of gross hematuria associated with an UTI, urinary retention and BPH with LU TS who presents for evaluation of an increase in frequency.  Background history Patient is a 9 -year-old Caucasian male who presents today as a referral from [facility] ED for gross hematuria.  Patient was found to have gross hematuria on 07/09/2017 after a Foley was placed.  Patient initially went to the ED due to urinary retention.  Bladder scan noted > 900 mL in his urine.  Once Foley was placed, patient developed gross hematuria.  Patient had been on two antibiotics by his PCP for an UTI by his PCP prior to his visit to the ED. His first antibiotic was Cipro, but he did not improve.  His second antibiotic was doxycycline.  UA from November 5 was nitrate positive, 10-50 WBCs, 10-50 RBCs and many bacteria.  No culture was performed.  His second UA on November 7 was positive for 10-50 WBCs, 4-10 RBCs, moderate bacteria.  Urine culture at that time was positive for E. coli.    The E. coli was resistant to Cipro, but sensitive to the doxycycline.  In the ED, he was given Rocephin and discharged home on Keflex and Flomax.   He states that he had an UTI in May.  He was given an antibiotics and his symptoms went away.  He has a remote history of nephrolithiasis that he passed spontaneously.  He has a history of BPH, but he has not had malignancies of the genitourinary tract.  He does not have a family medical history of nephrolithiasis (maybe a sister), malignancies of the genitourinary tract or hematuria.    At his visit on 07/15/2017, he had a Foley in place.  Prior to the placement of the Foley, he was experiencing dysuria, intermittency and  straining to urinate.  He was not experiencing any suprapubic pain, abdominal pain or flank pain. He denied any recent fevers, chills, nausea or vomiting.  He has not had any recent imaging studies.  He is not a smoker.   He is not exposed to secondhand smoke.  He has not worked with Sports administrator, trichloroethylene, etc.   He has a high BMI.  His Foley was removed for a TOV.  He was instructed to continue the tamsulosin 0.4 mg and finasteride 5 mg was added.    On 11/11/2017, his IPSS score on was 1, which is mild lower urinary tract symptomatology. He was pleased with his quality life due to his urinary symptoms.   His previous I PSS score was 4/2.  His previous PVR was 289 mL.  His UA was negative.   He had no complaints today.   He denied any dysuria, hematuria or suprapubic pain.   He currently taking tamsulosin 0.4 mg and finasteride 5 mg daily.  He also denies any recent fevers, chills, nausea or vomiting.  He does not have a family history of PCa. IPSS    Row Name 11/11/17 0900         International Prostate Symptom Score   How often have you had the sensation of not emptying your bladder?  Not at All     How often have you had  to urinate less than every two hours?  Not at All     How often have you found you stopped and started again several times when you urinated?  Not at All     How often have you found it difficult to postpone urination?  Not at All     How often have you had a weak urinary stream?  Not at All     How often have you had to strain to start urination?  Not at All     How many times did you typically get up at night to urinate?  1 Time     Total IPSS Score  1       Quality of Life due to urinary symptoms   If you were to spend the rest of your life with your urinary condition just the way it is now how would you feel about that?  Pleased        Score:  1-7 Mild 8-19 Moderate 20-35 Severe  He was seen on 12/02/2017 by Dr. Baldemar Lenis for a groin strain and at  that appointment he also mentioned that he had had an increase of urinary frequency and nocturia.   He was given a prescription for Cipro 500 mg bid for 10 days.  UA was positive for nitrate, greater than 50 WBCs and many bacteria.   His urine culture was positive for E. coli.  The organism was resistant to the Cipro that was prescribed.   He was then switched to doxycycline 100 mg 2 times daily on 5 April.  Today, he states he feels somewhat improved.  He is having the frequency and nocturia.  Patient denies any gross hematuria, dysuria or suprapubic/flank pain.  Patient denies any fevers, chills, nausea or vomiting. His PVR is 35 mL.  His UA today is positive for 6-10 WBCs.  He is leaving for a trip to [place] in mid June and does not want to have another UTI while he is out of the country.    He did mention he sits in a hot tube frequently.    PMH: Past Medical History:  Diagnosis Date  . Erectile dysfunction   . High cholesterol   . Hypertension   . Urinary retention     Surgical History: Past Surgical History:  Procedure Laterality Date  . NO PAST SURGERIES      Home Medications:  Allergies as of 12/11/2017   No Known Allergies     Medication List        Accurate as of 12/11/17  2:49 PM. Always use your most recent med list.          amLODipine 10 MG tablet Commonly known as:  NORVASC Take 10 mg daily by mouth.   aspirin EC 81 MG tablet Take 81 mg daily by mouth.   doxycycline 100 MG capsule Commonly known as:  VIBRAMYCIN   finasteride 5 MG tablet Commonly known as:  PROSCAR Take 1 tablet (5 mg total) daily by mouth.   olmesartan-hydrochlorothiazide 40-25 MG tablet Commonly known as:  BENICAR HCT Take 1 tablet daily by mouth.   sildenafil 50 MG tablet Commonly known as:  VIAGRA Take 50 mg as needed by mouth for erectile dysfunction.   simvastatin 40 MG tablet Commonly known as:  ZOCOR Take 40 mg daily at 6 PM by mouth.   tamsulosin 0.4 MG Caps  capsule Commonly known as:  FLOMAX Take 1 capsule (0.4 mg total) daily by mouth.  Allergies: No Known Allergies  Family History: Family History  Problem Relation Age of Onset  . Hypertension Father   . Hypercholesterolemia Father   . Hypertension Mother   . Hypercholesterolemia Mother     Social History:  reports that he has never smoked. He has never used smokeless tobacco. He reports that he drinks alcohol. He reports that he does not use drugs.  ROS: UROLOGY Frequent Urination?: Yes Hard to postpone urination?: No Burning/pain with urination?: No Get up at night to urinate?: Yes Leakage of urine?: No Urine stream starts and stops?: No Trouble starting stream?: No Do you have to strain to urinate?: No Blood in urine?: No Urinary tract infection?: Yes Sexually transmitted disease?: No Injury to kidneys or bladder?: No Painful intercourse?: No Erection problems?: No Penile pain?: No  Gastrointestinal Nausea?: No Vomiting?: No Indigestion/heartburn?: No Diarrhea?: No Constipation?: No  Constitutional Fever: No Night sweats?: No Weight loss?: No Fatigue?: No  Skin Skin rash/lesions?: No Itching?: No  Eyes Blurred vision?: No Double vision?: No  Ears/Nose/Throat Sore throat?: No Sinus problems?: No  Hematologic/Lymphatic Swollen glands?: No Easy bruising?: No  Cardiovascular Leg swelling?: No Chest pain?: No  Respiratory Cough?: No Shortness of breath?: No  Endocrine Excessive thirst?: No  Musculoskeletal Back pain?: No Joint pain?: No  Neurological Headaches?: No Dizziness?: No  Psychologic Depression?: No Anxiety?: No  Physical Exam: BP (!) 151/82   Pulse 84   Ht _0  (1.803 m)   Wt 221 lb (100.2 kg)   BMI 30.82 kg/m   Constitutional: Well nourished. Alert and oriented, No acute distress. HEENT: [place] AT, moist mucus membranes. Trachea midline, no masses. Cardiovascular: No clubbing, cyanosis, or  edema. Respiratory: Normal respiratory effort, no increased work of breathing. Skin: No rashes, bruises or suspicious lesions. Neurologic: Grossly intact, no focal deficits, moving all 4 extremities. Psychiatric: Normal mood and affect.   Laboratory Data: PSA History   1.17 in January 2016  1.01 in March 2017  1.16 in April 2018 Lab Results  Component Value Date   WBC 16.9 (H) 07/09/2017   HGB 16.1 07/09/2017   HCT 47.1 07/09/2017   MCV 85.4 07/09/2017   PLT 210 07/09/2017    Lab Results  Component Value Date   CREATININE 1.12 07/09/2017     Urinalysis 6-10 WBC's.  See Epic.   I have reviewed the labs.   Assessment & Plan:    1. Recurrent UTI's  - criteria for recurrent UTI has been met with 2 or more infections in 6 months or 3 or greater infections in one year   - patient is instructed to increase their water intake until the urine is pale yellow or clear (10 to 12 cups daily)   - patient is instructed to take probiotics (yogurt, oral pills or vaginal suppositories), take cranberry pills or drink the juice and Vitamin C 1,000 mg daily to acidify the urine   - obtain RUS to evaluate for a possible nidus for infections                               2. History of hematuria  - Associated with infection  - no reports of gross hematuria  - UA is negative for AMH  - RTC in one year for UA   3. History of urinary retention:     -continue the tamsulosin 0.4 mg daily and finasteride 5 mg daily  -return if unable to  urinate or experiencing suprapubic discomfort  -follow-up in twelve month for I PSS score, PVR and exam.  4. BPH with LU TS  - IPSS score: 1/1  - Continue conservative management, avoiding bladder irritants and timed voiding's  - Continue tamsulosin 0.4 mg daily and finasteride 5 mg daily; refills given  - RTC in 12 months for IPSS, PSA and exam   Return for pending urine culture results .  These notes generated with voice recognition software. I  apologize for typographical errors.  Zara Council, [facility] [address] [place], [place] [address] [phone]

## 2017-12-11 ENCOUNTER — Ambulatory Visit: Payer: Medicare Other | Admitting: Urology

## 2017-12-11 ENCOUNTER — Encounter: Payer: Self-pay | Admitting: Urology

## 2017-12-11 VITALS — BP 151/82 | HR 84 | Ht 71.0 in | Wt 221.0 lb

## 2017-12-11 DIAGNOSIS — N138 Other obstructive and reflux uropathy: Secondary | ICD-10-CM

## 2017-12-11 DIAGNOSIS — N390 Urinary tract infection, site not specified: Secondary | ICD-10-CM

## 2017-12-11 DIAGNOSIS — N401 Enlarged prostate with lower urinary tract symptoms: Secondary | ICD-10-CM | POA: Diagnosis not present

## 2017-12-11 LAB — URINALYSIS, COMPLETE
Bilirubin, UA: NEGATIVE
Glucose, UA: NEGATIVE
Ketones, UA: NEGATIVE
Nitrite, UA: NEGATIVE
Protein, UA: NEGATIVE
RBC, UA: NEGATIVE
Specific Gravity, UA: 1.02 (ref 1.005–1.030)
Urobilinogen, Ur: 0.2 mg/dL (ref 0.2–1.0)
pH, UA: 5.5 (ref 5.0–7.5)

## 2017-12-11 LAB — MICROSCOPIC EXAMINATION

## 2017-12-11 LAB — BLADDER SCAN AMB NON-IMAGING

## 2017-12-11 NOTE — Patient Instructions (Signed)

## 2017-12-13 LAB — CULTURE, URINE COMPREHENSIVE

## 2017-12-15 ENCOUNTER — Telehealth: Payer: Self-pay | Admitting: Family Medicine

## 2017-12-15 NOTE — Telephone Encounter (Signed)
Patient notified

## 2017-12-15 NOTE — Telephone Encounter (Signed)
-----   Message from Nori Riis, PA-C sent at 12/15/2017  9:45 AM EDT ----- Please let Mr. Surrette know that his urine culture is negative.

## 2017-12-19 ENCOUNTER — Telehealth: Payer: Self-pay | Admitting: Urology

## 2017-12-19 NOTE — Telephone Encounter (Signed)
Would you check on the status of his RUS? 

## 2017-12-22 ENCOUNTER — Telehealth: Payer: Self-pay | Admitting: Urology

## 2017-12-22 NOTE — Telephone Encounter (Signed)
They called the patient on 12-19-17 and left him a message to cb to schedule  They will keep trying   Sharyn Lull

## 2017-12-30 ENCOUNTER — Ambulatory Visit
Admission: RE | Admit: 2017-12-30 | Discharge: 2017-12-30 | Disposition: A | Discharge status: Home / Self Care | Payer: Medicare Other | Source: Ambulatory Visit | Attending: Urology | Admitting: Urology

## 2017-12-30 DIAGNOSIS — N390 Urinary tract infection, site not specified: Secondary | ICD-10-CM | POA: Diagnosis not present

## 2017-12-30 DIAGNOSIS — N281 Cyst of kidney, acquired: Secondary | ICD-10-CM | POA: Insufficient documentation

## 2017-12-31 ENCOUNTER — Telehealth: Payer: Self-pay

## 2017-12-31 NOTE — Telephone Encounter (Signed)
-----   Message from Nori Riis, PA-C sent at 12/31/2017  9:49 AM EDT ----- Please let Arthur Jones know that his renal ultrasound did not show any cause for having infections.  He did have two cysts in his left kidney.  These are benign and do not cause infections.  The next step would be to schedule a cystoscopy to look inside his bladder.

## 2017-12-31 NOTE — Telephone Encounter (Signed)
Patient notified, he states he does not know if he would like to proceed with cysto he will have to think about this and call back

## 2018-02-13 ENCOUNTER — Ambulatory Visit (INDEPENDENT_AMBULATORY_CARE_PROVIDER_SITE_OTHER): Payer: Medicare Other

## 2018-02-13 VITALS — BP 161/102 | HR 90 | Resp 16 | Ht 71.0 in | Wt 227.6 lb

## 2018-02-13 DIAGNOSIS — N390 Urinary tract infection, site not specified: Secondary | ICD-10-CM

## 2018-02-13 LAB — URINALYSIS, COMPLETE
Bilirubin, UA: NEGATIVE
Glucose, UA: NEGATIVE
Ketones, UA: NEGATIVE
Nitrite, UA: NEGATIVE
Specific Gravity, UA: 1.02 (ref 1.005–1.030)
Urobilinogen, Ur: 0.2 mg/dL (ref 0.2–1.0)
pH, UA: 6.5 (ref 5.0–7.5)

## 2018-02-13 LAB — MICROSCOPIC EXAMINATION
Epithelial Cells (non renal): NONE SEEN /hpf (ref 0–10)
RBC, UA: >30 /hpf — ABNORMAL HIGH (ref 0–2)
WBC, UA: >30 /hpf — ABNORMAL HIGH (ref 0–5)

## 2018-02-13 LAB — BLADDER SCAN AMB NON-IMAGING

## 2018-02-13 MED ORDER — SULFAMETHOXAZOLE-TRIMETHOPRIM 800-160 MG PO TABS: 1.0000 | ORAL_TABLET | Freq: Two times a day (BID) | ORAL | 0 refills | Status: AC

## 2018-02-13 NOTE — Progress Notes (Signed)
Arthur Jones is present in office today for nurse visit. Pt states he is having urinry frequency, urgency, dysuria, and difficulty postponing urination. A urine sample was provided for analysis. A post void bladder scan was completed, noting 5ml of urine present. Urine showed significant sign for infection, pt started on Bactrim per Dr. Erlene Quan. Culture sent for sensitivity.

## 2018-02-16 LAB — CULTURE, URINE COMPREHENSIVE

## 2018-03-09 ENCOUNTER — Telehealth: Payer: Self-pay | Admitting: Urology

## 2018-03-09 NOTE — Telephone Encounter (Signed)
Left detailed message. Pt can come in if he is symptomatic, if not, then no need to come in until his appointment.

## 2018-03-09 NOTE — Telephone Encounter (Signed)
Pt was having symptoms last week, but seem to be improving.  He has been to [place] for a week.  He wants to know if it would be necessary for him to come in and drop off a urine.  Pt is scheduled for a cysto 8/1 w/Stoioff  Please give pt a call  [phone]

## 2018-04-02 ENCOUNTER — Ambulatory Visit: Payer: Medicare Other | Admitting: Urology

## 2018-04-02 ENCOUNTER — Encounter: Payer: Self-pay | Admitting: Urology

## 2018-04-02 ENCOUNTER — Other Ambulatory Visit: Payer: Self-pay

## 2018-04-02 VITALS — BP 180/81 | HR 83 | Ht 71.0 in | Wt 224.2 lb

## 2018-04-02 DIAGNOSIS — N390 Urinary tract infection, site not specified: Secondary | ICD-10-CM

## 2018-04-02 LAB — URINALYSIS, COMPLETE
Bilirubin, UA: NEGATIVE
Glucose, UA: NEGATIVE
Ketones, UA: NEGATIVE
Leukocytes, UA: NEGATIVE
Nitrite, UA: NEGATIVE
Protein, UA: NEGATIVE
Specific Gravity, UA: 1.025 (ref 1.005–1.030)
Urobilinogen, Ur: 0.2 mg/dL (ref 0.2–1.0)
pH, UA: 5.5 (ref 5.0–7.5)

## 2018-04-02 LAB — MICROSCOPIC EXAMINATION
Epithelial Cells (non renal): NONE SEEN /hpf (ref 0–10)
WBC, UA: NONE SEEN /hpf (ref 0–5)

## 2018-04-02 MED ORDER — CIPROFLOXACIN HCL 500 MG PO TABS: 500.0000 mg | ORAL_TABLET | Freq: Once | ORAL | Status: DC

## 2018-04-02 MED ORDER — DOXYCYCLINE HYCLATE 100 MG PO TABS
100.0000 mg | ORAL_TABLET | Freq: Once | ORAL | Status: AC
  Administered 2018-04-02: 100 mg via ORAL

## 2018-04-02 MED ORDER — LIDOCAINE HCL URETHRAL/MUCOSAL 2 % EX GEL
1.0000 "application " | Freq: Once | CUTANEOUS | Status: AC
  Administered 2018-04-02: 1 via URETHRAL

## 2018-04-02 NOTE — Progress Notes (Signed)
   04/02/18  CC:  Chief Complaint  Patient presents with  . Cysto    HPI: History recurrent UTI/prostatitis and urinary retention.  On tamsulosin and finasteride  Blood pressure (!) 180/81, pulse 83, height 5\' 11"  (1.803 m), weight 224 lb 3.2 oz (101.7 kg). NED. A&Ox3.   No respiratory distress   Abd soft, NT, ND Normal phallus with bilateral descended testicles  Cystoscopy Procedure Note  Patient identification was confirmed, informed consent was obtained, and patient was prepped using Betadine solution.  Lidocaine jelly was administered per urethral meatus.    Preoperative abx where received prior to procedure.     Pre-Procedure: - Inspection reveals a normal caliber ureteral meatus.  Procedure: The flexible cystoscope was introduced without difficulty - No urethral strictures/lesions are present. - Moderate lateral lobe enlargement prostate; hypervascular/friable - Mild bladder neck elevation - Bilateral ureteral orifices identified - Bladder mucosa  reveals no ulcers, tumors, or lesions - No bladder stones - No trabeculation  Retroflexion shows no intravesical median lobe   Post-Procedure: - Patient tolerated the procedure well  Assessment/ Plan: Moderate prostate enlargement with inflammatory change otherwise negative cystoscopy.  Continue tamsulosin/finasteride.  Follow-up with Larene Beach in 6 weeks with a bladder scan.

## 2018-04-28 ENCOUNTER — Ambulatory Visit: Payer: Medicare Other | Admitting: Urology

## 2018-04-28 ENCOUNTER — Encounter: Payer: Self-pay | Admitting: Urology

## 2018-04-28 VITALS — BP 145/92 | HR 88 | Ht 71.0 in | Wt 224.5 lb

## 2018-04-28 DIAGNOSIS — R339 Retention of urine, unspecified: Secondary | ICD-10-CM

## 2018-04-28 DIAGNOSIS — N411 Chronic prostatitis: Secondary | ICD-10-CM | POA: Diagnosis not present

## 2018-04-28 LAB — BLADDER SCAN AMB NON-IMAGING: Scan Result: 142

## 2018-04-28 MED ORDER — SULFAMETHOXAZOLE-TRIMETHOPRIM 800-160 MG PO TABS: 1.0000 | ORAL_TABLET | Freq: Two times a day (BID) | ORAL | 0 refills | Status: AC

## 2018-04-28 NOTE — Progress Notes (Signed)
   04/28/2018 4:59 PM   Arthur Jones March 13, 1951 [id number]  Reason for visit: Follow up urinary frequency, burning  HPI: I the pleasure of seeing [name] in urology clinic today for follow-up of recurrent UTIs and urinary symptoms.  Briefly, he is a 67 year old healthy male who has had recurrent urinary infections/prostatitis with E. coli bacteria.  These have been culture proven.  He also had a cystoscopy recently with Dr. Bernardo Heater which showed an enlarged prostate with friable tissue.  No recent imaging to document prostate size.  Ultrasound April 2019 showed no hydronephrosis.  He is currently taking doxycycline prescribed by his primary for his urinary symptoms.  Duration of current symptoms is 3 to 4 days.  There are no aggravating or alleviating factors.  He also has a history of acute onset retention requiring Foley placement in November 2018.   ROS: Please see flowsheet from today's date for complete review of systems.  Physical Exam: BP (!) 145/92 (BP Location: Left Arm, Patient Position: Sitting, Cuff Size: Normal)   Pulse 88   Ht 5\' 11"  (1.803 m)   Wt 224 lb 8 oz (101.8 kg)   BMI 31.31 kg/m    Constitutional:  Alert and oriented, No acute distress. Respiratory: Normal respiratory effort, no increased work of breathing. GI: Abdomen is soft, nontender, nondistended, no abdominal masses Skin: No rashes, bruises or suspicious lesions. Neurologic: Grossly intact, no focal deficits, moving all 4 extremities. Psychiatric: Normal mood and affect  Laboratory Data: Urinalysis today negative, 0-5 WBCs, 0-2 RBCs, no bacteria, nitrite negative Urine culture February 13, 2018 grew E. coli.  Assessment & Plan:   In summary, Arthur Jones is a healthy 67 year old male with recurrent urinary infections/prostatitis.  He also has a history of acute onset retention in the fall 2018.  He has had a recurrence of his urinary symptoms, though his urine is negative today as he recently started  doxycycline.  I recommended a 4-week course of treatment Bactrim for better tissue penetration for prostatitis.  If he continues to have recurrent UTIs and/or urinary symptoms we could consider an outlet procedure in the future. Would recommend transrectal ultrasound to evaluate prostate volume prior to undergoing surgery.  Return in about 2 months (around 06/28/2018) for PVR, IPSS, visit with me.  Billey Co, [facility] [address], [place], [place] [address] [phone]

## 2018-04-29 LAB — URINALYSIS, COMPLETE
Bilirubin, UA: NEGATIVE
Glucose, UA: NEGATIVE
Ketones, UA: NEGATIVE
Nitrite, UA: NEGATIVE
Protein, UA: NEGATIVE
RBC, UA: NEGATIVE
Specific Gravity, UA: 1.02 (ref 1.005–1.030)
Urobilinogen, Ur: 0.2 mg/dL (ref 0.2–1.0)
pH, UA: 7 (ref 5.0–7.5)

## 2018-04-29 LAB — MICROSCOPIC EXAMINATION
Bacteria, UA: NONE SEEN
Epithelial Cells (non renal): NONE SEEN /hpf (ref 0–10)

## 2018-05-14 ENCOUNTER — Ambulatory Visit: Payer: Medicare Other | Admitting: Urology

## 2018-07-02 ENCOUNTER — Ambulatory Visit (INDEPENDENT_AMBULATORY_CARE_PROVIDER_SITE_OTHER): Payer: Medicare Other | Admitting: Urology

## 2018-07-02 ENCOUNTER — Encounter: Payer: Self-pay | Admitting: Urology

## 2018-07-02 VITALS — BP 142/98 | HR 89 | Ht 71.0 in | Wt 222.0 lb

## 2018-07-02 DIAGNOSIS — N401 Enlarged prostate with lower urinary tract symptoms: Secondary | ICD-10-CM

## 2018-07-02 DIAGNOSIS — N138 Other obstructive and reflux uropathy: Secondary | ICD-10-CM | POA: Diagnosis not present

## 2018-07-02 LAB — BLADDER SCAN AMB NON-IMAGING

## 2018-07-02 NOTE — Progress Notes (Signed)
   07/02/2018 5:58 PM   HOLLISTER WESSLER 1951/06/14 [id number]  Reason for visit: Follow up prostatitis  I saw Mr. Janoski in follow-up today after recent diagnosis of prostatitis.  He is a healthy and very active 67 year old male who had an E. coli prostatitis in June 2019, which we ultimately treated with 4 weeks of treatment Bactrim.  His symptoms have completely resolved and he denies any urinary symptoms at this time.  Notably he did require catheter placement approximately a year and a half ago for onset of acute retention.  He denies weak stream or straining at this time.  He is on maximal medical therapy with finasteride and Flomax.  PVR in clinic today is 70 cc  There is no cross-sectional imaging to evaluate prostate size.  He is an avid reader, and a big fan of [employer].  We discussed strategies for prevention of UTI at length including timed voiding, adequate hydration, and cranberry supplementation.  We also discussed outlet procedures briefly, but he feels he is doing very well at this time.  Follow-up in 1 year with PVR/IPSS score. RTC sooner if problems with UTI or retention  Please note 15 minutes were spent with the patient today, greater than 50% were spent in direct patient education and counseling regarding BPH, prostatitis, and urinary symptoms.  Billey Co, [facility] [address], [place], [place] [address] [phone]

## 2018-07-16 ENCOUNTER — Other Ambulatory Visit: Payer: Self-pay | Admitting: Urology

## 2018-10-08 DIAGNOSIS — M5440 Lumbago with sciatica, unspecified side: Secondary | ICD-10-CM | POA: Insufficient documentation

## 2018-10-08 DIAGNOSIS — M1611 Unilateral primary osteoarthritis, right hip: Secondary | ICD-10-CM | POA: Insufficient documentation

## 2018-10-22 ENCOUNTER — Other Ambulatory Visit: Payer: Self-pay | Admitting: Sports Medicine

## 2018-10-22 DIAGNOSIS — M5442 Lumbago with sciatica, left side: Principal | ICD-10-CM

## 2018-10-22 DIAGNOSIS — M5441 Lumbago with sciatica, right side: Principal | ICD-10-CM

## 2018-10-22 DIAGNOSIS — G8929 Other chronic pain: Secondary | ICD-10-CM

## 2018-11-04 ENCOUNTER — Other Ambulatory Visit: Payer: Self-pay

## 2018-11-04 ENCOUNTER — Ambulatory Visit
Admission: RE | Admit: 2018-11-04 | Discharge: 2018-11-04 | Disposition: A | Discharge status: Home / Self Care | Payer: Medicare Other | Source: Ambulatory Visit | Attending: Sports Medicine | Admitting: Sports Medicine

## 2018-11-04 DIAGNOSIS — M5441 Lumbago with sciatica, right side: Secondary | ICD-10-CM | POA: Insufficient documentation

## 2018-11-04 DIAGNOSIS — M5442 Lumbago with sciatica, left side: Secondary | ICD-10-CM | POA: Insufficient documentation

## 2018-11-04 DIAGNOSIS — G8929 Other chronic pain: Secondary | ICD-10-CM | POA: Diagnosis present

## 2018-11-11 ENCOUNTER — Ambulatory Visit: Payer: Medicare Other | Admitting: Urology

## 2019-01-14 ENCOUNTER — Encounter
Admission: RE | Admit: 2019-01-14 | Discharge: 2019-01-14 | Disposition: A | Discharge status: Home / Self Care | Payer: Medicare Other | Source: Ambulatory Visit | Attending: Orthopedic Surgery | Admitting: Orthopedic Surgery

## 2019-01-14 ENCOUNTER — Other Ambulatory Visit: Payer: Self-pay

## 2019-01-14 DIAGNOSIS — Z1159 Encounter for screening for other viral diseases: Secondary | ICD-10-CM | POA: Insufficient documentation

## 2019-01-14 DIAGNOSIS — Z01818 Encounter for other preprocedural examination: Secondary | ICD-10-CM | POA: Diagnosis not present

## 2019-01-14 DIAGNOSIS — E785 Hyperlipidemia, unspecified: Secondary | ICD-10-CM | POA: Insufficient documentation

## 2019-01-14 LAB — APTT: aPTT: 32 seconds (ref 24–36)

## 2019-01-14 LAB — COMPREHENSIVE METABOLIC PANEL
ALT: 36 U/L (ref 0–44)
AST: 31 U/L (ref 15–41)
Albumin: 4.8 g/dL (ref 3.5–5.0)
Alkaline Phosphatase: 57 U/L (ref 38–126)
Anion gap: 11 (ref 5–15)
BUN: 15 mg/dL (ref 8–23)
CO2: 25 mmol/L (ref 22–32)
Calcium: 9.8 mg/dL (ref 8.9–10.3)
Chloride: 106 mmol/L (ref 98–111)
Creatinine, Ser: 1.02 mg/dL (ref 0.61–1.24)
GFR calc Af Amer: >60 mL/min (ref >60)
GFR calc non Af Amer: >60 mL/min (ref >60)
Glucose, Bld: 99 mg/dL (ref 70–99)
Potassium: 3.4 mmol/L — ABNORMAL LOW (ref 3.5–5.1)
Sodium: 142 mmol/L (ref 135–145)
Total Bilirubin: 1.7 mg/dL — ABNORMAL HIGH (ref 0.3–1.2)
Total Protein: 7.5 g/dL (ref 6.5–8.1)

## 2019-01-14 LAB — CBC
HCT: 45.4 % (ref 39.0–52.0)
Hemoglobin: 15.7 g/dL (ref 13.0–17.0)
MCH: 28.9 pg (ref 26.0–34.0)
MCHC: 34.6 g/dL (ref 30.0–36.0)
MCV: 83.6 fL (ref 80.0–100.0)
Platelets: 255 10*3/uL (ref 150–400)
RBC: 5.43 MIL/uL (ref 4.22–5.81)
RDW: 13 % (ref 11.5–15.5)
WBC: 8.7 10*3/uL (ref 4.0–10.5)
nRBC: 0 % (ref 0.0–0.2)

## 2019-01-14 LAB — URINALYSIS, ROUTINE W REFLEX MICROSCOPIC
Bilirubin Urine: NEGATIVE
Glucose, UA: NEGATIVE mg/dL
Hgb urine dipstick: NEGATIVE
Ketones, ur: NEGATIVE mg/dL
Leukocytes,Ua: NEGATIVE
Nitrite: NEGATIVE
Protein, ur: NEGATIVE mg/dL
Specific Gravity, Urine: 1.018 (ref 1.005–1.030)
pH: 5 (ref 5.0–8.0)

## 2019-01-14 LAB — TYPE AND SCREEN
ABO/RH(D): A NEG
Antibody Screen: NEGATIVE

## 2019-01-14 LAB — PROTIME-INR
INR: 0.9 (ref 0.8–1.2)
Prothrombin Time: 12.4 seconds (ref 11.4–15.2)

## 2019-01-14 LAB — SEDIMENTATION RATE: Sed Rate: 3 mm/hr (ref 0–20)

## 2019-01-14 LAB — SURGICAL PCR SCREEN
MRSA, PCR: NEGATIVE
Staphylococcus aureus: POSITIVE — AB

## 2019-01-14 LAB — C-REACTIVE PROTEIN: CRP: <0.8 mg/dL (ref <1.0)

## 2019-01-14 NOTE — Patient Instructions (Signed)
Your procedure is scheduled on: mONDAY 01/18/19 Report to [facility]. To find out your arrival time please call [phone] between 1PM - 3PM on fRIDAY 01/15/19.  Remember: Instructions that are not followed completely may result in serious medical risk, up to and including death, or upon the discretion of your surgeon and anesthesiologist your surgery may need to be rescheduled.     _X__ 1. Do not eat food after midnight the night before your procedure.                 No gum chewing or hard candies. You may drink clear liquids up to 2 hours                 before you are scheduled to arrive for your surgery- DO not drink clear                 liquids within 2 hours of the start of your surgery.                       Ensure Pre-surgery drink at 4:30 am  __X__2.  On the morning of surgery brush your teeth with toothpaste and water, you                 may rinse your mouth with mouthwash if you wish.  Do not swallow any              toothpaste of mouthwash.     _X__ 3.  No Alcohol for 24 hours before or after surgery.   _X__ 4.  Do Not Smoke or use e-cigarettes For 24 Hours Prior to Your Surgery.                 Do not use any chewable tobacco products for at least 6 hours prior to                 surgery.  ____  5.  Bring all medications with you on the day of surgery if instructed.   __X__  6.  Notify your doctor if there is any change in your medical condition      (cold, fever, infections).     Do not wear jewelry, make-up, hairpins, clips or nail polish. Do not wear lotions, powders, or perfumes.  Do not shave 48 hours prior to surgery. Men may shave face and neck. Do not bring valuables to the hospital.    [facility] is not responsible for any belongings or valuables.  Contacts, dentures/partials or body piercings may not be worn into surgery. Bring a case for your contacts, glasses or hearing aids, a denture cup will be  supplied. Leave your suitcase in the car. After surgery it may be brought to your room. For patients admitted to the hospital, discharge time is determined by your treatment team.   Patients discharged the day of surgery will not be allowed to drive home.   Please read over the following fact sheets that you were given:   MRSA Information  __X__ Take these medicines the morning of surgery with A SIP OF WATER:    1. finasteride (PROSCAR)  2.   3.   4.  5.  6.  ____ Fleet Enema (as directed)   __X__ Use CHG Soap/SAGE wipes as directed  ____ Use inhalers on the day of surgery  ____ Stop metformin/Janumet/Farxiga 2 days prior to surgery  ____ Take 1/2 of usual insulin dose the night before surgery. No insulin the morning          of surgery.   ____ Stop Blood Thinners Coumadin/Plavix/Xarelto/Pleta/Pradaxa/Eliquis/Effient/Aspirin  on   Or contact your Surgeon, Cardiologist or Medical Doctor regarding  ability to stop your blood thinners  __X__ Stop Anti-inflammatories 7 days before surgery such as Advil, Ibuprofen, Motrin,  BC or Goodies Powder, Naprosyn, Naproxen, Aleve, Aspirin   Stop today   __X__ Stop all herbal supplements, fish oil or vitamin E until after surgery.    ____ Bring C-Pap to the hospital.     MUST SELF Lawrence

## 2019-01-15 LAB — URINE CULTURE
Culture: <10000 — AB
Special Requests: NORMAL

## 2019-01-15 LAB — NOVEL CORONAVIRUS, NAA (HOSP ORDER, SEND-OUT TO REF LAB; TAT 18-24 HRS): SARS-CoV-2, NAA: NOT DETECTED

## 2019-01-18 ENCOUNTER — Inpatient Hospital Stay: Payer: Medicare Other | Admitting: Anesthesiology

## 2019-01-18 ENCOUNTER — Inpatient Hospital Stay
Admission: RE | Admit: 2019-01-18 | Discharge: 2019-01-20 | DRG: 470 | Disposition: A | Discharge status: Home Health | Payer: Medicare Other | Attending: Orthopedic Surgery | Admitting: Orthopedic Surgery

## 2019-01-18 ENCOUNTER — Other Ambulatory Visit: Payer: Self-pay

## 2019-01-18 ENCOUNTER — Encounter
Admission: RE | Disposition: A | Discharge status: Home Health | Payer: Self-pay | Source: Home / Self Care | Attending: Orthopedic Surgery

## 2019-01-18 ENCOUNTER — Inpatient Hospital Stay: Payer: Medicare Other

## 2019-01-18 DIAGNOSIS — I10 Essential (primary) hypertension: Secondary | ICD-10-CM | POA: Diagnosis present

## 2019-01-18 DIAGNOSIS — Z1159 Encounter for screening for other viral diseases: Secondary | ICD-10-CM | POA: Diagnosis not present

## 2019-01-18 DIAGNOSIS — Z6832 Body mass index (BMI) 32.0-32.9, adult: Secondary | ICD-10-CM | POA: Diagnosis not present

## 2019-01-18 DIAGNOSIS — E669 Obesity, unspecified: Secondary | ICD-10-CM | POA: Diagnosis present

## 2019-01-18 DIAGNOSIS — M1611 Unilateral primary osteoarthritis, right hip: Principal | ICD-10-CM | POA: Diagnosis present

## 2019-01-18 DIAGNOSIS — Z96649 Presence of unspecified artificial hip joint: Secondary | ICD-10-CM

## 2019-01-18 DIAGNOSIS — E7800 Pure hypercholesterolemia, unspecified: Secondary | ICD-10-CM | POA: Diagnosis present

## 2019-01-18 HISTORY — PX: TOTAL HIP ARTHROPLASTY: SHX124

## 2019-01-18 LAB — ABO/RH: ABO/RH(D): A NEG

## 2019-01-18 SURGERY — ARTHROPLASTY, HIP, TOTAL,POSTERIOR APPROACH
Anesthesia: Spinal | Laterality: Right

## 2019-01-18 MED ORDER — FINASTERIDE 5 MG PO TABS
5.0000 mg | ORAL_TABLET | Freq: Every day | ORAL | Status: DC
  Administered 2019-01-19 – 2019-01-20 (×2): 5 mg via ORAL
  Filled 2019-01-18 (×2): qty 1

## 2019-01-18 MED ORDER — AMLODIPINE BESYLATE 10 MG PO TABS
10.0000 mg | ORAL_TABLET | Freq: Every morning | ORAL | Status: DC
  Administered 2019-01-19 – 2019-01-20 (×2): 10 mg via ORAL
  Filled 2019-01-18 (×2): qty 1

## 2019-01-18 MED ORDER — GABAPENTIN 300 MG PO CAPS
300.0000 mg | ORAL_CAPSULE | Freq: Every day | ORAL | Status: DC
  Administered 2019-01-18 – 2019-01-19 (×2): 300 mg via ORAL
  Filled 2019-01-18 (×2): qty 1

## 2019-01-18 MED ORDER — GABAPENTIN 300 MG PO CAPS
300.0000 mg | ORAL_CAPSULE | Freq: Once | ORAL | Status: AC
  Administered 2019-01-18: 300 mg via ORAL

## 2019-01-18 MED ORDER — FLEET ENEMA 7-19 GM/118ML RE ENEM: 1.0000 | ENEMA | Freq: Once | RECTAL | Status: DC | PRN

## 2019-01-18 MED ORDER — OXYMETAZOLINE HCL 0.05 % NA SOLN
NASAL | Status: AC
  Filled 2019-01-18: qty 30

## 2019-01-18 MED ORDER — CEFAZOLIN SODIUM-DEXTROSE 2-4 GM/100ML-% IV SOLN
2.0000 g | INTRAVENOUS | Status: AC
  Administered 2019-01-18: 08:00:00 2000 mg via INTRAVENOUS

## 2019-01-18 MED ORDER — FENTANYL CITRATE (PF) 100 MCG/2ML IJ SOLN
25.0000 ug | INTRAMUSCULAR | Status: DC | PRN
  Administered 2019-01-18 (×4): 25 ug via INTRAVENOUS

## 2019-01-18 MED ORDER — TELMISARTAN-HCTZ 40-12.5 MG PO TABS: 1.0000 | ORAL_TABLET | Freq: Every day | ORAL | Status: DC

## 2019-01-18 MED ORDER — BUPIVACAINE HCL (PF) 0.5 % IJ SOLN
INTRAMUSCULAR | Status: DC | PRN
  Administered 2019-01-18: 3 mL

## 2019-01-18 MED ORDER — OXYCODONE HCL 5 MG PO TABS
10.0000 mg | ORAL_TABLET | ORAL | Status: DC | PRN
  Administered 2019-01-18 – 2019-01-19 (×2): 10 mg via ORAL
  Filled 2019-01-18 (×3): qty 2

## 2019-01-18 MED ORDER — NEOMYCIN-POLYMYXIN B GU 40-200000 IR SOLN
Status: AC
  Filled 2019-01-18: qty 20

## 2019-01-18 MED ORDER — ACETAMINOPHEN 10 MG/ML IV SOLN
INTRAVENOUS | Status: DC | PRN
  Administered 2019-01-18: 1000 mg via INTRAVENOUS

## 2019-01-18 MED ORDER — METHOCARBAMOL 750 MG PO TABS: 750.0000 mg | ORAL_TABLET | Freq: Two times a day (BID) | ORAL | Status: DC | PRN

## 2019-01-18 MED ORDER — TRANEXAMIC ACID-NACL 1000-0.7 MG/100ML-% IV SOLN
1000.0000 mg | INTRAVENOUS | Status: AC
  Administered 2019-01-18: 08:00:00 1000 mg via INTRAVENOUS
  Filled 2019-01-18: qty 100

## 2019-01-18 MED ORDER — PANTOPRAZOLE SODIUM 40 MG PO TBEC
40.0000 mg | DELAYED_RELEASE_TABLET | Freq: Two times a day (BID) | ORAL | Status: DC
  Administered 2019-01-18 – 2019-01-20 (×5): 40 mg via ORAL
  Filled 2019-01-18 (×5): qty 1

## 2019-01-18 MED ORDER — CEFAZOLIN SODIUM-DEXTROSE 2-4 GM/100ML-% IV SOLN
2.0000 g | Freq: Four times a day (QID) | INTRAVENOUS | Status: AC
  Administered 2019-01-18 – 2019-01-19 (×4): 2 g via INTRAVENOUS
  Filled 2019-01-18 (×4): qty 100

## 2019-01-18 MED ORDER — EPHEDRINE SULFATE 50 MG/ML IJ SOLN
INTRAMUSCULAR | Status: AC
  Filled 2019-01-18: qty 1

## 2019-01-18 MED ORDER — BISACODYL 10 MG RE SUPP: 10.0000 mg | Freq: Every day | RECTAL | Status: DC | PRN

## 2019-01-18 MED ORDER — GABAPENTIN 300 MG PO CAPS
ORAL_CAPSULE | ORAL | Status: AC
  Administered 2019-01-18: 300 mg via ORAL
  Filled 2019-01-18: qty 1

## 2019-01-18 MED ORDER — EPHEDRINE SULFATE 50 MG/ML IJ SOLN
INTRAMUSCULAR | Status: DC | PRN
  Administered 2019-01-18: 15 mg via INTRAVENOUS

## 2019-01-18 MED ORDER — PHENYLEPHRINE HCL (PRESSORS) 10 MG/ML IV SOLN
INTRAVENOUS | Status: DC | PRN
  Administered 2019-01-18: 100 ug via INTRAVENOUS
  Administered 2019-01-18: 50 ug via INTRAVENOUS
  Administered 2019-01-18 (×2): 100 ug via INTRAVENOUS

## 2019-01-18 MED ORDER — TRAMADOL HCL 50 MG PO TABS
50.0000 mg | ORAL_TABLET | ORAL | Status: DC | PRN
  Administered 2019-01-18: 100 mg via ORAL
  Filled 2019-01-18: qty 2

## 2019-01-18 MED ORDER — ACETAMINOPHEN 10 MG/ML IV SOLN
1000.0000 mg | Freq: Four times a day (QID) | INTRAVENOUS | Status: AC
  Administered 2019-01-18 – 2019-01-19 (×4): 1000 mg via INTRAVENOUS
  Filled 2019-01-18 (×4): qty 100

## 2019-01-18 MED ORDER — CEFAZOLIN SODIUM-DEXTROSE 2-4 GM/100ML-% IV SOLN
INTRAVENOUS | Status: AC
  Filled 2019-01-18: qty 100

## 2019-01-18 MED ORDER — FAMOTIDINE 20 MG PO TABS
20.0000 mg | ORAL_TABLET | Freq: Once | ORAL | Status: AC
  Administered 2019-01-18: 20 mg via ORAL

## 2019-01-18 MED ORDER — MIDAZOLAM HCL 2 MG/2ML IJ SOLN
INTRAMUSCULAR | Status: AC
  Filled 2019-01-18: qty 2

## 2019-01-18 MED ORDER — DEXAMETHASONE SODIUM PHOSPHATE 10 MG/ML IJ SOLN
8.0000 mg | Freq: Once | INTRAMUSCULAR | Status: AC
  Administered 2019-01-18: 07:00:00 8 mg via INTRAVENOUS

## 2019-01-18 MED ORDER — BUPIVACAINE HCL (PF) 0.5 % IJ SOLN
INTRAMUSCULAR | Status: AC
  Filled 2019-01-18: qty 10

## 2019-01-18 MED ORDER — OXYCODONE HCL 5 MG PO TABS
5.0000 mg | ORAL_TABLET | ORAL | Status: DC | PRN
  Administered 2019-01-19 – 2019-01-20 (×2): 5 mg via ORAL
  Filled 2019-01-18 (×2): qty 1

## 2019-01-18 MED ORDER — DEXAMETHASONE SODIUM PHOSPHATE 10 MG/ML IJ SOLN
INTRAMUSCULAR | Status: AC
  Administered 2019-01-18: 07:00:00 8 mg via INTRAVENOUS
  Filled 2019-01-18: qty 1

## 2019-01-18 MED ORDER — ONDANSETRON HCL 4 MG/2ML IJ SOLN: 4.0000 mg | Freq: Once | INTRAMUSCULAR | Status: DC | PRN

## 2019-01-18 MED ORDER — SENNOSIDES-DOCUSATE SODIUM 8.6-50 MG PO TABS
1.0000 | ORAL_TABLET | Freq: Two times a day (BID) | ORAL | Status: DC
  Administered 2019-01-18 – 2019-01-19 (×2): 1 via ORAL
  Filled 2019-01-18 (×4): qty 1

## 2019-01-18 MED ORDER — SIMVASTATIN 40 MG PO TABS
40.0000 mg | ORAL_TABLET | Freq: Every evening | ORAL | Status: DC
  Administered 2019-01-18 – 2019-01-19 (×2): 40 mg via ORAL
  Filled 2019-01-18: qty 1
  Filled 2019-01-18: qty 2
  Filled 2019-01-18: qty 1
  Filled 2019-01-18: qty 2
  Filled 2019-01-18: qty 1

## 2019-01-18 MED ORDER — METOCLOPRAMIDE HCL 5 MG/ML IJ SOLN: 5.0000 mg | Freq: Three times a day (TID) | INTRAMUSCULAR | Status: DC | PRN

## 2019-01-18 MED ORDER — MIDAZOLAM HCL 5 MG/5ML IJ SOLN
INTRAMUSCULAR | Status: DC | PRN
  Administered 2019-01-18 (×2): 1 mg via INTRAVENOUS

## 2019-01-18 MED ORDER — MAGNESIUM HYDROXIDE 400 MG/5ML PO SUSP
30.0000 mL | Freq: Every day | ORAL | Status: DC
  Administered 2019-01-18 – 2019-01-19 (×2): 30 mL via ORAL
  Filled 2019-01-18 (×3): qty 30

## 2019-01-18 MED ORDER — LACTATED RINGERS IV SOLN
INTRAVENOUS | Status: DC
  Administered 2019-01-18 (×2): via INTRAVENOUS

## 2019-01-18 MED ORDER — ONDANSETRON HCL 4 MG PO TABS: 4.0000 mg | ORAL_TABLET | Freq: Four times a day (QID) | ORAL | Status: DC | PRN

## 2019-01-18 MED ORDER — ALUM & MAG HYDROXIDE-SIMETH 200-200-20 MG/5ML PO SUSP: 30.0000 mL | ORAL | Status: DC | PRN

## 2019-01-18 MED ORDER — PROPOFOL 500 MG/50ML IV EMUL
INTRAVENOUS | Status: AC
  Filled 2019-01-18: qty 50

## 2019-01-18 MED ORDER — IRBESARTAN 150 MG PO TABS
150.0000 mg | ORAL_TABLET | Freq: Every day | ORAL | Status: DC
  Administered 2019-01-18 – 2019-01-20 (×3): 150 mg via ORAL
  Filled 2019-01-18 (×3): qty 1

## 2019-01-18 MED ORDER — ACETAMINOPHEN 10 MG/ML IV SOLN
INTRAVENOUS | Status: AC
  Filled 2019-01-18: qty 100

## 2019-01-18 MED ORDER — FLUTICASONE PROPIONATE 50 MCG/ACT NA SUSP: 1.0000 | Freq: Every day | NASAL | Status: DC | PRN

## 2019-01-18 MED ORDER — CELECOXIB 200 MG PO CAPS
200.0000 mg | ORAL_CAPSULE | Freq: Two times a day (BID) | ORAL | Status: DC
  Administered 2019-01-18 – 2019-01-20 (×4): 200 mg via ORAL
  Filled 2019-01-18 (×5): qty 1

## 2019-01-18 MED ORDER — PHENOL 1.4 % MT LIQD: 1.0000 | OROMUCOSAL | Status: DC | PRN

## 2019-01-18 MED ORDER — HYDROMORPHONE HCL 1 MG/ML IJ SOLN: 0.5000 mg | INTRAMUSCULAR | Status: DC | PRN

## 2019-01-18 MED ORDER — FAMOTIDINE 20 MG PO TABS
ORAL_TABLET | ORAL | Status: AC
  Administered 2019-01-18: 20 mg via ORAL
  Filled 2019-01-18: qty 1

## 2019-01-18 MED ORDER — DIPHENHYDRAMINE HCL 12.5 MG/5ML PO ELIX
12.5000 mg | ORAL_SOLUTION | ORAL | Status: DC | PRN
  Filled 2019-01-18: qty 10

## 2019-01-18 MED ORDER — CELECOXIB 200 MG PO CAPS
400.0000 mg | ORAL_CAPSULE | Freq: Once | ORAL | Status: AC
  Administered 2019-01-18: 400 mg via ORAL

## 2019-01-18 MED ORDER — CHLORHEXIDINE GLUCONATE 4 % EX LIQD: 60.0000 mL | Freq: Once | CUTANEOUS | Status: DC

## 2019-01-18 MED ORDER — METOCLOPRAMIDE HCL 10 MG PO TABS
5.0000 mg | ORAL_TABLET | Freq: Three times a day (TID) | ORAL | Status: DC | PRN
  Filled 2019-01-18: qty 1

## 2019-01-18 MED ORDER — NEOMYCIN-POLYMYXIN B GU 40-200000 IR SOLN
Status: DC | PRN
  Administered 2019-01-18: 16 mL

## 2019-01-18 MED ORDER — SODIUM CHLORIDE 0.9 % IV SOLN
INTRAVENOUS | Status: DC | PRN
  Administered 2019-01-18: 20 ug/min via INTRAVENOUS

## 2019-01-18 MED ORDER — TRANEXAMIC ACID-NACL 1000-0.7 MG/100ML-% IV SOLN
1000.0000 mg | Freq: Once | INTRAVENOUS | Status: AC
  Administered 2019-01-18: 11:00:00 1000 mg via INTRAVENOUS
  Filled 2019-01-18: qty 100

## 2019-01-18 MED ORDER — PROPOFOL 500 MG/50ML IV EMUL
INTRAVENOUS | Status: DC | PRN
  Administered 2019-01-18: 100 ug/kg/min via INTRAVENOUS

## 2019-01-18 MED ORDER — FERROUS SULFATE 325 (65 FE) MG PO TABS
325.0000 mg | ORAL_TABLET | Freq: Two times a day (BID) | ORAL | Status: DC
  Administered 2019-01-18 – 2019-01-20 (×4): 325 mg via ORAL
  Filled 2019-01-18 (×4): qty 1

## 2019-01-18 MED ORDER — CELECOXIB 200 MG PO CAPS
ORAL_CAPSULE | ORAL | Status: AC
  Administered 2019-01-18: 06:00:00 400 mg via ORAL
  Filled 2019-01-18: qty 2

## 2019-01-18 MED ORDER — METOCLOPRAMIDE HCL 10 MG PO TABS
10.0000 mg | ORAL_TABLET | Freq: Three times a day (TID) | ORAL | Status: DC
  Administered 2019-01-18 – 2019-01-20 (×7): 10 mg via ORAL
  Filled 2019-01-18: qty 2
  Filled 2019-01-18: qty 1
  Filled 2019-01-18 (×2): qty 2
  Filled 2019-01-18 (×3): qty 1
  Filled 2019-01-18: qty 2
  Filled 2019-01-18: qty 1
  Filled 2019-01-18: qty 2
  Filled 2019-01-18: qty 1
  Filled 2019-01-18: qty 2
  Filled 2019-01-18 (×2): qty 1

## 2019-01-18 MED ORDER — PHENYLEPHRINE HCL (PRESSORS) 10 MG/ML IV SOLN
INTRAVENOUS | Status: AC
  Filled 2019-01-18: qty 1

## 2019-01-18 MED ORDER — ACETAMINOPHEN 325 MG PO TABS: 325.0000 mg | ORAL_TABLET | Freq: Four times a day (QID) | ORAL | Status: DC | PRN

## 2019-01-18 MED ORDER — ONDANSETRON HCL 4 MG/2ML IJ SOLN: 4.0000 mg | Freq: Four times a day (QID) | INTRAMUSCULAR | Status: DC | PRN

## 2019-01-18 MED ORDER — HYDROCHLOROTHIAZIDE 12.5 MG PO CAPS
12.5000 mg | ORAL_CAPSULE | Freq: Every day | ORAL | Status: DC
  Administered 2019-01-18 – 2019-01-20 (×3): 12.5 mg via ORAL
  Filled 2019-01-18 (×3): qty 1

## 2019-01-18 MED ORDER — MENTHOL 3 MG MT LOZG: 1.0000 | LOZENGE | OROMUCOSAL | Status: DC | PRN

## 2019-01-18 MED ORDER — ENOXAPARIN SODIUM 30 MG/0.3ML ~~LOC~~ SOLN
30.0000 mg | Freq: Two times a day (BID) | SUBCUTANEOUS | Status: DC
  Administered 2019-01-19 – 2019-01-20 (×3): 30 mg via SUBCUTANEOUS
  Filled 2019-01-18 (×3): qty 0.3

## 2019-01-18 MED ORDER — FENTANYL CITRATE (PF) 100 MCG/2ML IJ SOLN
INTRAMUSCULAR | Status: AC
  Administered 2019-01-18: 11:00:00 25 ug via INTRAVENOUS
  Filled 2019-01-18: qty 2

## 2019-01-18 MED ORDER — SODIUM CHLORIDE 0.9 % IV SOLN
INTRAVENOUS | Status: DC
  Administered 2019-01-18 – 2019-01-19 (×2): via INTRAVENOUS

## 2019-01-18 SURGICAL SUPPLY — 55 items
BLADE DRUM FLTD (BLADE) ×3 IMPLANT
BLADE SAW 90X25X1.19 OSCILLAT (BLADE) ×3 IMPLANT
CANISTER SUCT 1200ML W/VALVE (MISCELLANEOUS) ×3 IMPLANT
CANISTER SUCT 3000ML PPV (MISCELLANEOUS) ×6 IMPLANT
CARTRIDGE OIL MAESTRO DRILL (MISCELLANEOUS) ×1 IMPLANT
COVER WAND RF STERILE (DRAPES) ×3 IMPLANT
CUP ACETBLR 54 OD 100 SERIES (Hips) ×3 IMPLANT
DIFFUSER DRILL AIR PNEUMATIC (MISCELLANEOUS) ×3 IMPLANT
DRAPE INCISE IOBAN 66X60 STRL (DRAPES) ×3 IMPLANT
DRAPE SHEET LG 3/4 BI-LAMINATE (DRAPES) ×3 IMPLANT
DRSG DERMACEA 8X12 NADH (GAUZE/BANDAGES/DRESSINGS) ×3 IMPLANT
DRSG OPSITE POSTOP 4X12 (GAUZE/BANDAGES/DRESSINGS) IMPLANT
DRSG OPSITE POSTOP 4X14 (GAUZE/BANDAGES/DRESSINGS) ×3 IMPLANT
DRSG TEGADERM 4X4.75 (GAUZE/BANDAGES/DRESSINGS) ×3 IMPLANT
DURAPREP 26ML APPLICATOR (WOUND CARE) ×6 IMPLANT
ELECT BLADE 6.5 EXT (BLADE) IMPLANT
ELECT CAUTERY BLADE 6.4 (BLADE) IMPLANT
GLOVE BIOGEL M STRL SZ7.5 (GLOVE) ×6 IMPLANT
GLOVE INDICATOR 8.0 STRL GRN (GLOVE) ×3 IMPLANT
GOWN STRL REUS W/ TWL LRG LVL3 (GOWN DISPOSABLE) ×2 IMPLANT
GOWN STRL REUS W/TWL LRG LVL3 (GOWN DISPOSABLE) ×4
HEAD M SROM 36MM PLUS 1.5 (Hips) ×1 IMPLANT
HEMOVAC 400CC 10FR (MISCELLANEOUS) ×3 IMPLANT
HOLDER FOLEY CATH W/STRAP (MISCELLANEOUS) ×3 IMPLANT
HOOD PEEL AWAY FLYTE STAYCOOL (MISCELLANEOUS) ×9 IMPLANT
KIT TURNOVER KIT A (KITS) ×3 IMPLANT
LINER NEUTRAL 36ID 54OD (Liner) ×3 IMPLANT
MANIFOLD NEPTUNE WASTE (CANNULA) ×3 IMPLANT
NDL SAFETY ECLIPSE 18X1.5 (NEEDLE) ×1 IMPLANT
NEEDLE HYPO 18GX1.5 SHARP (NEEDLE) ×2
NS IRRIG 500ML POUR BTL (IV SOLUTION) ×3 IMPLANT
OIL CARTRIDGE MAESTRO DRILL (MISCELLANEOUS) ×3
PACK HIP PROSTHESIS (MISCELLANEOUS) ×3 IMPLANT
PENCIL SMOKE ULTRAEVAC 22 CON (MISCELLANEOUS) ×3 IMPLANT
PIN STEIN THRED 5/32 (Pin) ×3 IMPLANT
PULSAVAC PLUS IRRIG FAN TIP (DISPOSABLE) ×3
SOL .9 NS 3000ML IRR  AL (IV SOLUTION) ×2
SOL .9 NS 3000ML IRR UROMATIC (IV SOLUTION) ×1 IMPLANT
SOL PREP PVP 2OZ (MISCELLANEOUS) ×3
SOLUTION PREP PVP 2OZ (MISCELLANEOUS) ×1 IMPLANT
SPONGE DRAIN TRACH 4X4 STRL 2S (GAUZE/BANDAGES/DRESSINGS) ×3 IMPLANT
SROM M HEAD 36MM PLUS 1.5 (Hips) ×3 IMPLANT
STAPLER SKIN PROX 35W (STAPLE) ×3 IMPLANT
STEM FEM CMNTLSS LG AML 13.5 (Hips) ×3 IMPLANT
SUT ETHIBOND #5 BRAIDED 30INL (SUTURE) ×3 IMPLANT
SUT VIC AB 0 CT1 36 (SUTURE) ×3 IMPLANT
SUT VIC AB 1 CT1 36 (SUTURE) ×6 IMPLANT
SUT VIC AB 2-0 CT1 27 (SUTURE) ×2
SUT VIC AB 2-0 CT1 TAPERPNT 27 (SUTURE) ×1 IMPLANT
SYR 20CC LL (SYRINGE) ×3 IMPLANT
TAPE CLOTH 3X10 WHT NS LF (GAUZE/BANDAGES/DRESSINGS) ×3 IMPLANT
TAPE TRANSPORE STRL 2 31045 (GAUZE/BANDAGES/DRESSINGS) ×3 IMPLANT
TIP FAN IRRIG PULSAVAC PLUS (DISPOSABLE) ×1 IMPLANT
TOWEL OR 17X26 4PK STRL BLUE (TOWEL DISPOSABLE) IMPLANT
TRAY FOLEY MTR SLVR 16FR STAT (SET/KITS/TRAYS/PACK) ×3 IMPLANT

## 2019-01-18 NOTE — Transfer of Care (Signed)
Immediate Anesthesia Transfer of Care Note  Patient: Arthur Jones  Procedure(s) Performed: TOTAL HIP ARTHROPLASTY - RIGHT (Right )  Patient Location: PACU  Anesthesia Type:Spinal  Level of Consciousness: drowsy and patient cooperative  Airway & Oxygen Therapy: Patient Spontanous Breathing and Patient connected to face mask oxygen  Post-op Assessment: Report given to RN and Post -op Vital signs reviewed and stable  Post vital signs: Reviewed and stable  Last Vitals:  Vitals Value Taken Time  BP 115/80 01/18/2019 10:38 AM  Temp 36.3 C 01/18/2019 10:38 AM  Pulse 72 01/18/2019 10:38 AM  Resp 19 01/18/2019 10:38 AM  SpO2 98 % 01/18/2019 10:38 AM  Vitals shown include unvalidated device data.  Last Pain:  Vitals:   01/18/19 0601  TempSrc: Temporal  PainSc: 0-No pain         Complications: No apparent anesthesia complications

## 2019-01-18 NOTE — OR Nursing (Signed)
Instructed how to use incentive spirometer with return demonstration.

## 2019-01-18 NOTE — Anesthesia Preprocedure Evaluation (Signed)
Anesthesia Evaluation  Patient identified by MRN, date of birth, ID band Patient awake    Reviewed: Allergy & Precautions, NPO status , Patient's Chart, lab work & pertinent test results  History of Anesthesia Complications Negative for: history of anesthetic complications  Airway Mallampati: III  TM Distance: >3 FB Neck ROM: Full    Dental no notable dental hx.    Pulmonary neg pulmonary ROS, neg sleep apnea, neg COPD,    breath sounds clear to auscultation- rhonchi (-) wheezing      Cardiovascular Exercise Tolerance: Good hypertension, Pt. on medications (-) CAD, (-) Past MI, (-) Cardiac Stents and (-) CABG  Rhythm:Regular Rate:Normal - Systolic murmurs and - Diastolic murmurs    Neuro/Psych neg Seizures negative neurological ROS  negative psych ROS   GI/Hepatic negative GI ROS, Neg liver ROS,   Endo/Other  negative endocrine ROSneg diabetes  Renal/GU negative Renal ROS     Musculoskeletal  (+) Arthritis ,   Abdominal (+) + obese,   Peds  Hematology negative hematology ROS (+)   Anesthesia Other Findings Past Medical History: No date: Erectile dysfunction No date: High cholesterol No date: Hypertension No date: Urinary retention   Reproductive/Obstetrics                             Anesthesia Physical Anesthesia Plan  ASA: II  Anesthesia Plan: Spinal   Post-op Pain Management:    Induction:   PONV Risk Score and Plan: 1 and Propofol infusion  Airway Management Planned: Natural Airway  Additional Equipment:   Intra-op Plan:   Post-operative Plan:   Informed Consent: I have reviewed the patients History and Physical, chart, labs and discussed the procedure including the risks, benefits and alternatives for the proposed anesthesia with the patient or authorized representative who has indicated his/her understanding and acceptance.     Dental advisory given  Plan  Discussed with: CRNA and Anesthesiologist  Anesthesia Plan Comments:         Anesthesia Quick Evaluation

## 2019-01-18 NOTE — Anesthesia Post-op Follow-up Note (Signed)
Anesthesia QCDR form completed.        

## 2019-01-18 NOTE — TOC Initial Note (Signed)
Transition of Care [facility]) - Initial/Assessment Note    Patient Details  Name: Arthur Jones MRN: [id number] Date of Birth: 1951-04-22  Transition of Care [facility]) CM/SW Contact:    Su Hilt, RN Phone Number: 01/18/2019, 1:52 PM  Clinical Narrative:                 Met with the patient to discuss DC plan and needs He lives at home with his wife He hjas a RW and cane at home and has no other DME needs He is [facility] with [facility] for [facility] health prior to surgery, Notified Helene Kelp of the patient being here and will notify at DC Will get the price for the Lovenox prior to DC   Expected Discharge Plan: [place] Barriers to Discharge: Continued Medical Work up   Patient Goals and CMS Choice Patient states their goals for this hospitalization and ongoing recovery are:: go home get well so he can go hiking CMS Medicare.gov Compare Post Acute Care list provided to:: Patient Choice offered to / list presented to : Patient  Expected Discharge Plan and Services Expected Discharge Plan: [place]   Discharge Planning Services: CM Consult Post Acute Care Choice: [place] arrangements for the past 2 months: Single Family Home                   DME Agency: [facility] Date DME Agency Contacted: 01/18/19 Time DME Agency Contacted: [place]: [facility] [employer] [facility] [employer] Date [place]: 01/18/19 Time [place]: [place] Representative spoke with at [place]: [place] Arrangements/Services Living arrangements for the past 2 months: [place] Lives with:: Spouse Patient language and need for interpreter reviewed:: No Do you feel safe going back to the place where you live?: Yes      Need for Family Participation in Patient Care: No (Comment) Care giver support system in place?: Yes (comment) Current home services: DME(cane, rolling  walker) Criminal Activity/Legal Involvement Pertinent to Current Situation/Hospitalization: No - Comment as needed  Activities of Daily Living Home Assistive Devices/Equipment: Cane (specify quad or straight), Eyeglasses ADL Screening (condition at time of admission) Patient's cognitive ability adequate to safely complete daily activities?: Yes Is the patient deaf or have difficulty hearing?: No Does the patient have difficulty seeing, even when wearing glasses/contacts?: No Does the patient have difficulty concentrating, remembering, or making decisions?: No Patient able to express need for assistance with ADLs?: Yes Does the patient have difficulty dressing or bathing?: No Independently performs ADLs?: Yes (appropriate for developmental age) Does the patient have difficulty walking or climbing stairs?: Yes Weakness of Legs: Right Weakness of Arms/Hands: None  Permission Sought/Granted                  Emotional Assessment Appearance:: Appears stated age Attitude/Demeanor/Rapport: Engaged Affect (typically observed): Accepting Orientation: : Oriented to Self, Oriented to Place, Oriented to  Time, Oriented to Situation Alcohol / Substance Use: Never Used Psych Involvement: No (comment)  Admission diagnosis:  PRIMARY OSEOARTHRITIS OF RIGHT HIP Patient Active Problem List   Diagnosis Date Noted  . H/O total hip arthroplasty 01/18/2019  . Low back pain with sciatica 10/08/2018  . Primary osteoarthritis of right hip 10/08/2018  . Erectile dysfunction   . Benign essential hypertension 02/25/2014  . Degenerative arthritis of left knee 02/17/2014  . Degenerative arthritis  of right knee 02/17/2014   PCP:  Derinda Late, MD Pharmacy:   [place] [address] Phone: [phone] Fax: [phone]  [place], [place] [address] [place] [address] Phone:  [phone] Fax: [phone]     Social Determinants of Health (SDOH) Interventions    Readmission Risk Interventions No flowsheet data found.

## 2019-01-18 NOTE — Discharge Instructions (Signed)
Instructions after Total Hip Replacement ° ° °  Darbie Biancardi P. Amarrah Meinhart, Jr., M.D.    ° Dept. of Orthopaedics & Sports Medicine ° [facility] ° [address] ° [place], [place]  [address] ° Phone: [phone]   Fax: [phone] ° °  °DIET: °• Drink plenty of non-alcoholic fluids. °• Resume your normal diet. Include foods high in fiber. ° °ACTIVITY:  °• You may use crutches or a walker with weight-bearing as tolerated, unless instructed otherwise. °• You may be weaned off of the walker or crutches by your Physical Therapist.  °• Do NOT reach below the level of your knees or cross your legs until allowed.    °• Continue doing gentle exercises. Exercising will reduce the pain and swelling, increase motion, and prevent muscle weakness.   °• Please continue to use the TED compression stockings for 6 weeks. You may remove the stockings at night, but should reapply them in the morning. °• Do not drive or operate any equipment until instructed. ° °WOUND CARE:  °• Continue to use ice packs periodically to reduce pain and swelling. °• Keep the incision clean and dry. °• You may bathe or shower after the staples are removed at the first office visit following surgery. ° °MEDICATIONS: °• You may resume your regular medications. °• Please take the pain medication as prescribed on the medication. °• Do not take pain medication on an empty stomach. °• You have been given a prescription for a blood thinner to prevent blood clots. Please take the medication as instructed. (NOTE: After completing a 2 week course of Lovenox, take one Enteric-coated aspirin once a day.) °• Pain medications and iron supplements can cause constipation. Use a stool softener (Senokot or Colace) on a daily basis and a laxative (dulcolax or miralax) as needed. °• Do not drive or drink alcoholic beverages when taking pain medications. ° °CALL THE OFFICE FOR: °• Temperature above 101 degrees °• Excessive bleeding or drainage on the dressing. °• Excessive  swelling, coldness, or paleness of the toes. °• Persistent nausea and vomiting. ° °FOLLOW-UP:  °• You should have an appointment to return to the office in 6 weeks after surgery. °• Arrangements have been made for continuation of Physical Therapy (either home therapy or outpatient therapy). °  °

## 2019-01-18 NOTE — H&P (Signed)
The patient has been re-examined, and the chart reviewed, and there have been no interval changes to the documented history and physical.    The risks, benefits, and alternatives have been discussed at length. The patient expressed understanding of the risks benefits and agreed with plans for surgical intervention.  Avory Rahimi P. Othel Dicostanzo, Jr. M.D.    

## 2019-01-18 NOTE — Evaluation (Signed)
Physical Therapy Evaluation Patient Details Name: Arthur Jones MRN: [id number] DOB: 08-10-1951 Today's Date: 01/18/2019   History of Present Illness  admitted for acute hospitalization status post R THR, posterior approach, WBAT (01/18/2019)  Clinical Impression  Upon evaluation, patient alert and oriented; follows commands and demonstrates good insight/effort with all mobility tasks.  Pain well-controlled; good post-op strength (at least 3-/5) and ROM (within THPs) noted throughout R LE.  Currently able to complete bed mobility with min assist; sit/stand, basic transfers and gait (35') with RW, cga/min assist.  Slow and guarded, but with fair/good loading and stance phase to R LE; good control and overall stability.  Anticipate consistent progression towards all mobility goals; very motivated to recover optimal functional ability (and return to hiking). Would benefit from skilled PT to address above deficits and promote optimal return to PLOF; Recommend transition to [place] upon discharge from acute hospitalization.     Follow Up Recommendations Home health PT    Equipment Recommendations  3in1 (PT)(has RW, SPC and walking stick)    Recommendations for Other Services       Precautions / Restrictions Precautions Precautions: Fall;Posterior Hip Restrictions Weight Bearing Restrictions: Yes RLE Weight Bearing: Weight bearing as tolerated      Mobility  Bed Mobility Overal bed mobility: Needs Assistance Bed Mobility: Supine to Sit     Supine to sit: Min assist     General bed mobility comments: for R LE positioning and management  Transfers Overall transfer level: Needs assistance Equipment used: Rolling walker (2 wheeled) Transfers: Sit to/from Stand Sit to Stand: Min guard;Min assist         General transfer comment: cuing for hand placement and adherence to R THPs; good tolerance for WBing, good active use with movement transition  Ambulation/Gait Ambulation/Gait  assistance: Min guard Gait Distance (Feet): 35 Feet Assistive device: Rolling walker (2 wheeled)       General Gait Details: step to progressing to partial step through gait pattern; fair loading/stance time R LE with good hip/knee control.  Slow and steady, but good safety awareness, good effort with all gait tasks  Stairs            Wheelchair Mobility    Modified Rankin (Stroke Patients Only)       Balance Overall balance assessment: Needs assistance Sitting-balance support: No upper extremity supported;Feet supported Sitting balance-Leahy Scale: Good     Standing balance support: Bilateral upper extremity supported Standing balance-Leahy Scale: Fair                               Pertinent Vitals/Pain Pain Assessment: No/denies pain    Home Living Family/patient expects to be discharged to:: Private residence Living Arrangements: Spouse/significant other Available Help at Discharge: Family;Available 24 hours/day Type of Home: House Home Access: Stairs to enter   [employer] of Steps: 2 Home Layout: Two level(R ascending rail) Home Equipment: Walker - 2 wheels;Cane - single point(walking stick) Additional Comments: bed/bath on main level of home; stationary bike and home office on upper level of home    Prior Function Level of Independence: Independent         Comments: Indep without assist device as baseline mobility, active hiker and biker; recent use of RW or SPC for mobility due to progressive difficulty with R hip     Hand Dominance        Extremity/Trunk Assessment   Upper Extremity Assessment Upper Extremity  Assessment: Overall WFL for tasks assessed    Lower Extremity Assessment Lower Extremity Assessment: (R hip grossly 3-/5, limited by post-op soreness and THPs; otherwise at least 4/5 through hip and knee.  Sensation fully returned)       Communication   Communication: No difficulties  Cognition  Arousal/Alertness: Awake/alert Behavior During Therapy: WFL for tasks assessed/performed Overall Cognitive Status: Within Functional Limits for tasks assessed                                        General Comments      Exercises Other Exercises Other Exercises: Supine R LE therex, 1x10, AROM for muscular strength/endurance: ankle pumps, quad sets, heel slides, SAQs, hip abduct/adduct.  Good isolated strength and control.   Assessment/Plan    PT Assessment Patient needs continued PT services  PT Problem List Decreased strength;Decreased range of motion;Decreased activity tolerance;Decreased balance;Decreased mobility;Decreased knowledge of use of DME;Decreased safety awareness;Decreased knowledge of precautions;Pain       PT Treatment Interventions DME instruction;Gait training;Stair training;Functional mobility training;Therapeutic activities;Therapeutic exercise;Balance training;Patient/family education    PT Goals (Current goals can be found in the Care Plan section)  Acute Rehab PT Goals Patient Stated Goal: to get back to hiking PT Goal Formulation: With patient Time For Goal Achievement: 02/01/19 Potential to Achieve Goals: Good    Frequency BID   Barriers to discharge        Co-evaluation               AM-PAC PT "6 Clicks" Mobility  Outcome Measure Help needed turning from your back to your side while in a flat bed without using bedrails?: A Little Help needed moving from lying on your back to sitting on the side of a flat bed without using bedrails?: A Little Help needed moving to and from a bed to a chair (including a wheelchair)?: A Little Help needed standing up from a chair using your arms (e.g., wheelchair or bedside chair)?: A Little Help needed to walk in hospital room?: A Little Help needed climbing 3-5 steps with a railing? : A Little 6 Click Score: 18    End of Session Equipment Utilized During Treatment: Gait belt Activity  Tolerance: Patient tolerated treatment well Patient left: in chair;with call bell/phone within reach;with chair alarm set Nurse Communication: Mobility status PT Visit Diagnosis: Muscle weakness (generalized) (M62.81);Difficulty in walking, not elsewhere classified (R26.2);Pain Pain - Right/Left: Right Pain - part of body: Hip    Time: 1450-1529 PT Time Calculation (min) (ACUTE ONLY): 39 min   Charges:   PT Evaluation $PT Eval Moderate Complexity: 1 Mod PT Treatments $Gait Training: 8-22 mins $Therapeutic Exercise: 8-22 mins        Storm Dulski H. Owens Shark, PT, DPT, NCS 01/18/19, 3:57 PM [phone]

## 2019-01-18 NOTE — Op Note (Signed)
OPERATIVE NOTE  DATE OF SURGERY:  01/18/2019  PATIENT NAME:  Arthur Jones   DOB: [birth year]  MRN: [id number]  PRE-OPERATIVE DIAGNOSIS: Degenerative arthrosis of the right hip, primary  POST-OPERATIVE DIAGNOSIS:  Same  PROCEDURE:  Right total hip arthroplasty  SURGEON:  Marciano Sequin. M.D.  ASSISTANT: Vance Peper, PA (present and scrubbed throughout the case, critical for assistance with exposure, retraction, instrumentation, and closure)  ANESTHESIA: spinal  ESTIMATED BLOOD LOSS: 400 mL  FLUIDS REPLACED: 1000 mL of crystalloid  DRAINS: 2 medium drains to a Hemovac reservoir  IMPLANTS UTILIZED: DePuy 13.5 mm large stature AML femoral stem, 54 mm OD Pinnacle 100 acetabular component, neutral Pinnacle Altrx polyethylene insert, and a 36 mm M-SPEC +1.5 mm hip ball  INDICATIONS FOR SURGERY: Arthur Jones is a 68 y.o. year old male with a long history of progressive hip and groin  pain. X-rays demonstrated severe degenerative changes. The patient had not seen any significant improvement despite conservative nonsurgical intervention. After discussion of the risks and benefits of surgical intervention, the patient expressed understanding of the risks benefits and agree with plans for total hip arthroplasty.   The risks, benefits, and alternatives were discussed at length including but not limited to the risks of infection, bleeding, nerve injury, stiffness, blood clots, the need for revision surgery, limb length inequality, dislocation, cardiopulmonary complications, among others, and they were willing to proceed.  PROCEDURE IN DETAIL: The patient was brought into the operating room and, after adequate spinal anesthesia was achieved, the patient was placed in a left lateral decubitus position. Axillary roll was placed and all bony prominences were well-padded. The patient's right hip was cleaned and prepped with alcohol and DuraPrep and draped in the usual sterile fashion. A "timeout"  was performed as per usual protocol. A lateral curvilinear incision was made gently curving towards the posterior superior iliac spine. The IT band was incised in line with the skin incision and the fibers of the gluteus maximus were split in line. The piriformis tendon was identified, skeletonized, and incised at its insertion to the proximal femur and reflected posteriorly. A T type posterior capsulotomy was performed. Prior to dislocation of the femoral head, a threaded Steinmann pin was inserted through a separate stab incision into the pelvis superior to the acetabulum and bent in the form of a stylus so as to assess limb length and hip offset throughout the procedure. The femoral head was then dislocated posteriorly. Inspection of the femoral head demonstrated severe degenerative changes with full-thickness loss of articular cartilage. The femoral neck cut was performed using an oscillating saw. The anterior capsule was elevated off of the femoral neck using a periosteal elevator. Attention was then directed to the acetabulum. The remnant of the labrum was excised using electrocautery. Inspection of the acetabulum also demonstrated significant degenerative changes. The acetabulum was reamed in sequential fashion up to a 53 mm diameter. Good punctate bleeding bone was encountered. A 54 mm Pinnacle 100 acetabular component was positioned and impacted into place. Good scratch fit was appreciated. A neutral polyethylene trial was inserted.  Attention was then directed to the proximal femur. A hole for reaming of the proximal femoral canal was created using a high-speed burr. The femoral canal was reamed in sequential fashion up to a 13 mm diameter. This allowed for approximately 6 cm of scratch fit. Serial broaches were inserted up to a 13.5 mm large stature femoral broach. Calcar region was planed and a trial reduction was performed using  a 36 mm hip ball with a +1.5 mm neck length. Good equalization of limb  lengths and hip offset was appreciated and excellent stability was noted both anteriorly and posteriorly. Trial components were removed. The acetabular shell was irrigated with copious amounts of normal saline with antibiotic solution and suctioned dry. A neutral Pinnacle Altrx polyethylene insert was positioned and impacted into place. Next, a 13.5 mm large stature AML femoral stem was positioned and impacted into place. Excellent scratch fit was appreciated. A trial reduction was again performed with a 36 mm hip ball with a +1.5 mm neck length. Again, good equalization of limb lengths was appreciated and excellent stability appreciated both anteriorly and posteriorly. The hip was then dislocated and the trial hip ball was removed. The Morse taper was cleaned and dried. A 36 mm M-SPEC hip ball with a +1.5 mm neck length was placed on the trunnion and impacted into place. The hip was then reduced and placed through range of motion. Excellent stability was appreciated both anteriorly and posteriorly.  The wound was irrigated with copious amounts of normal saline with antibiotic solution and suctioned dry. Good hemostasis was appreciated. The posterior capsulotomy was repaired using #5 Ethibond. Piriformis tendon was reapproximated to the undersurface of the gluteus medius tendon using #5 Ethibond. Two medium drains were placed in the wound bed and brought out through separate stab incisions to be attached to a Hemovac reservoir. The IT band was reapproximated using interrupted sutures of #1 Vicryl. Subcutaneous tissue was approximated using first #0 Vicryl followed by #2-0 Vicryl. The skin was closed with skin staples.  The patient tolerated the procedure well and was transported to the recovery room in stable condition.   Marciano Sequin., M.D.

## 2019-01-18 NOTE — Anesthesia Procedure Notes (Signed)
Spinal  Patient location during procedure: OR Start time: 01/18/2019 7:20 AM End time: 01/18/2019 7:26 AM Staffing Anesthesiologist: Emmie Niemann, MD Resident/CRNA: Jonna Clark, CRNA Performed: resident/CRNA  Preanesthetic Checklist Completed: patient identified, site marked, surgical consent, pre-op evaluation, timeout performed, IV checked, risks and benefits discussed and monitors and equipment checked Spinal Block Patient position: sitting Prep: Betadine Patient monitoring: heart rate, continuous pulse ox and blood pressure Approach: midline Location: L4-5 Injection technique: single-shot Needle Needle type: Introducer and Pencil-Tip  Needle gauge: 24 G Needle length: 9 cm Additional Notes Negative paresthesia. Negative blood return. Positive free-flowing CSF. Expiration date of kit checked and confirmed. Patient tolerated procedure well, without complications.

## 2019-01-19 LAB — SURGICAL PATHOLOGY

## 2019-01-19 MED ORDER — TRAMADOL HCL 50 MG PO TABS: 50.0000 mg | ORAL_TABLET | Freq: Four times a day (QID) | ORAL | 0 refills | Status: DC | PRN

## 2019-01-19 MED ORDER — ENOXAPARIN SODIUM 40 MG/0.4ML ~~LOC~~ SOLN: 40.0000 mg | SUBCUTANEOUS | 0 refills | Status: DC

## 2019-01-19 MED ORDER — OXYCODONE HCL 5 MG PO TABS: 5.0000 mg | ORAL_TABLET | ORAL | 0 refills | Status: DC | PRN

## 2019-01-19 NOTE — Anesthesia Postprocedure Evaluation (Signed)
Anesthesia Post Note  Patient: Arthur Jones  Procedure(s) Performed: TOTAL HIP ARTHROPLASTY - RIGHT (Right )  Patient location during evaluation: Nursing Unit Anesthesia Type: Spinal Level of consciousness: awake and alert and oriented Pain management: pain level controlled Vital Signs Assessment: post-procedure vital signs reviewed and stable Respiratory status: spontaneous breathing Cardiovascular status: stable Postop Assessment: no headache, no backache, no apparent nausea or vomiting, patient able to bend at knees, adequate PO intake and able to ambulate Anesthetic complications: no     Last Vitals:  Vitals:   01/19/19 0511 01/19/19 0718  BP: 140/87 137/89  Pulse: 72 71  Resp: 19 17  Temp: 36.6 C 36.5 C  SpO2: 97% 96%    Last Pain:  Vitals:   01/19/19 0718  TempSrc: Oral  PainSc:                  [name]

## 2019-01-19 NOTE — TOC Benefit Eligibility Note (Signed)
Transition of Care [facility]) Benefit Eligibility Note    Patient Details  Name: Arthur Jones MRN: [id number] Date of Birth: 10-03-1950   Medication/Dose: Lovenox 8m once daily for 14 days  Covered?: Yes  Tier: Other(Tier 4)  Prescription Coverage Preferred Pharmacy: Any retail pharmacy  Spoke with Person/Company/Phone Number:: Tyra at [phone] Co-Pay: $100 maximum cost for Tier 4 medication  Prior Approval: No  Deductible: Met  Additional Notes: Generic Enoxaparin covered.  Tier 1 with no PA required.  Estimated copay cost: $10.      HDannette BarbaraPhone Number: [phone]/19/2020, 8:51 AM

## 2019-01-19 NOTE — Progress Notes (Signed)
Physical Therapy Treatment Patient Details Name: Arthur Jones MRN: [id number] DOB: 1951/08/16 Today's Date: 01/19/2019    History of Present Illness 68 y/o male status post R THR, posterior approach, WBAT (01/18/2019)    PT Comments    Pt again did well with PT this date.  He showed great quad control and overall R LE strength, some difficulty initially with SLRs, but able to perform 10 reps w/o assist.  He did need some cuing to recall hip precautions, but was able to maintain them during transitions, etc and did not need direct assist with mobility.  He was able to circumambulate the nurses' station safely and with consistent cadence once warmed up. He did lack appropriate heel strike/R LE mechanics but improved with cuing and displayed good confidence.  Pt overall doing well, will likely trial steps with afternoon.   Follow Up Recommendations  Home health PT     Equipment Recommendations  3in1 (PT)    Recommendations for Other Services       Precautions / Restrictions Precautions Precautions: Fall;Posterior Hip Restrictions Weight Bearing Restrictions: Yes RLE Weight Bearing: Weight bearing as tolerated    Mobility  Bed Mobility Overal bed mobility: Needs Assistance Bed Mobility: Supine to Sit     Supine to sit: Supervision     General bed mobility comments: Pt able to get LEs to EOB with appropriate positioning and good safety  Transfers Overall transfer level: Needs assistance Equipment used: Rolling walker (2 wheeled) Transfers: Sit to/from Stand Sit to Stand: Supervision         General transfer comment: Cues for hand placement and precautions, good effort and no need for phyiscal assist  Ambulation/Gait Ambulation/Gait assistance: Min guard Gait Distance (Feet): 200 Feet Assistive device: Rolling walker (2 wheeled)       General Gait Details: Pt initially with hesitant step-to gait with reliance on walker, however with increased distance he had  increased comfort and was able to increase cadence with equal steppage and decreased reliance on the walker.  Pt with some mild limp lacking full knee extension with foot flat weight acceptance.  Pt safe and relatively consistent t/o the effort.   Stairs             Wheelchair Mobility    Modified Rankin (Stroke Patients Only)       Balance Overall balance assessment: Modified Independent Sitting-balance support: No upper extremity supported;Feet supported Sitting balance-Leahy Scale: Good       Standing balance-Leahy Scale: Good Standing balance comment: Pt able to maintian static balance w/o AD, reliant on UEs during dynamic tasks                            Cognition Arousal/Alertness: Awake/alert Behavior During Therapy: WFL for tasks assessed/performed Overall Cognitive Status: Within Functional Limits for tasks assessed                                        Exercises Total Joint Exercises Ankle Circles/Pumps: AROM;10 reps Quad Sets: Strengthening;15 reps Short Arc Quad: Strengthening;15 reps Heel Slides: Strengthening;10 reps(with resisted leg extensions) Hip ABduction/ADduction: Strengthening;10 reps Straight Leg Raises: AROM;10 reps Knee Flexion: Strengthening;10 reps    General Comments        Pertinent Vitals/Pain Pain Assessment: 0-10 Pain Score: 3     Home Living  Prior Function            PT Goals (current goals can now be found in the care plan section) Progress towards PT goals: Progressing toward goals    Frequency    BID      PT Plan Current plan remains appropriate    Co-evaluation              AM-PAC PT "6 Clicks" Mobility   Outcome Measure  Help needed turning from your back to your side while in a flat bed without using bedrails?: None Help needed moving from lying on your back to sitting on the side of a flat bed without using bedrails?: None Help needed  moving to and from a bed to a chair (including a wheelchair)?: None Help needed standing up from a chair using your arms (e.g., wheelchair or bedside chair)?: A Little Help needed to walk in hospital room?: A Little Help needed climbing 3-5 steps with a railing? : A Little 6 Click Score: 21    End of Session Equipment Utilized During Treatment: Gait belt Activity Tolerance: Patient tolerated treatment well Patient left: in chair;with call bell/phone within reach;with chair alarm set Nurse Communication: Mobility status PT Visit Diagnosis: Muscle weakness (generalized) (M62.81);Difficulty in walking, not elsewhere classified (R26.2);Pain Pain - Right/Left: Right Pain - part of body: Hip     Time: 0820-0854 PT Time Calculation (min) (ACUTE ONLY): 34 min  Charges:  $Gait Training: 8-22 mins $Therapeutic Exercise: 8-22 mins                     Kreg Shropshire, DPT 01/19/2019, 10:27 AM

## 2019-01-19 NOTE — Progress Notes (Signed)
ORTHOPAEDICS PROGRESS NOTE  PATIENT NAME: Arthur Jones DOB: Dec 09, 1950  MRN: [id number]  POD # 1: Right total hip arthroplasty  Subjective: The patient rested well last night.  Pain has been under good control. The patient denies any nausea or vomiting. He did extremely well with physical therapy yesterday, walking 35 feet.  Objective: Vital signs in last 24 hours: Temp:  [97.2 F (36.2 C)-98.6 F (37 C)] 97.9 F (36.6 C) (05/19 0511) Pulse Rate:  [69-89] 72 (05/19 0511) Resp:  [14-22] 19 (05/19 0511) BP: (101-140)/(70-91) 140/87 (05/19 0511) SpO2:  [94 %-99 %] 97 % (05/19 0511) Weight:  [105.1 kg] 105.1 kg (05/18 1956)  Intake/Output from previous day: 05/18 0701 - 05/19 0700 In: 3538.9 [P.O.:200; I.V.:2628.7; IV Piggyback:710.2] Out: 3410 [Urine:2548; Drains:462; Blood:400]  No results for input(s): WBC, HGB, HCT, PLT, K, CL, CO2, BUN, CREATININE, GLUCOSE, CALCIUM, LABPT, INR in the last 72 hours.  EXAM General: Well-developed well-nourished male seen in no apparent discomfort. Lungs: clear to auscultation Cardiac: normal rate and regular rhythm Abdomen: Soft, nontender, nondistended.  Bowel sounds are present. Right lower extremity: Dressing is dry and intact.  Hemovac drain is intact.  No significant ecchymosis or swelling to the thigh.  Homans test is negative. Neurologic: Come alert, and oriented.  Sensory and motor function are intact.  Assessment: Right total hip arthroplasty  Secondary diagnoses: Hypertension Hypercholesterolemia  Plan: Today's goals were reviewed with the patient.  Continue with physical therapy and Occupational Therapy as per total hip arthroplasty rehab protocol. The patient may be weightbearing as tolerated.  Posterior hip precautions are to be continued. Plan is to go Home after hospital stay. DVT Prophylaxis - Lovenox, Foot Pumps and TED hose  James P. Holley Bouche M.D.

## 2019-01-19 NOTE — TOC Progression Note (Signed)
Transition of Care [facility]) - Progression Note    Patient Details  Name: Arthur Jones MRN: [id number] Date of Birth: Feb 05, 1951  Transition of Care [facility]) CM/SW [place], RN Phone Number: 01/19/2019, 9:47 AM  Clinical Narrative:     Notified the patient of the cost of Lovenox being 10$ for generic He is pleased  Expected Discharge Plan: [place] Barriers to Discharge: Continued Medical Work up  Expected Discharge Plan and Services Expected Discharge Plan: [place]   Discharge Planning Services: CM Consult Post Acute Care Choice: [place] arrangements for the past 2 months: Single Family Home                   DME Agency: [facility] Date DME Agency Contacted: 01/18/19 Time DME Agency Contacted: [place]: [facility] (formerly [employer]) Date [place]: 01/18/19 Time [place]: [place] Representative spoke with at [place]: [place] ([place]) Interventions    Readmission Risk Interventions No flowsheet data found.

## 2019-01-19 NOTE — Discharge Summary (Signed)
Physician Discharge Summary  Patient ID: Arthur Jones MRN: [id number] DOB/AGE: 70-Aug-1952 70 y.o.  Admit date: 01/18/2019 Discharge date: 01/20/2019  Admission Diagnoses:  PRIMARY OSEOARTHRITIS OF RIGHT HIP   Discharge Diagnoses: Patient Active Problem List   Diagnosis Date Noted  . H/O total hip arthroplasty 01/18/2019  . Low back pain with sciatica 10/08/2018  . Primary osteoarthritis of right hip 10/08/2018  . Erectile dysfunction   . Benign essential hypertension 02/25/2014  . Degenerative arthritis of left knee 02/17/2014  . Degenerative arthritis of right knee 02/17/2014    Past Medical History:  Diagnosis Date  . Erectile dysfunction   . High cholesterol   . Hypertension   . Urinary retention      Transfusion: No transfusion during this admission   Consultants (if any): None  Discharged Condition: Improved  Hospital Course: Arthur Jones is an 68 y.o. male who was admitted 01/18/2019 with a diagnosis of degenerative arthrosis right hip and went to the operating room on 01/18/2019 and underwent the above named procedures.    Surgeries:Procedure(s): TOTAL HIP ARTHROPLASTY - RIGHT on 01/18/2019  PRE-OPERATIVE DIAGNOSIS: Degenerative arthrosis of the right hip, primary  POST-OPERATIVE DIAGNOSIS:  Same  PROCEDURE:  Right total hip arthroplasty  SURGEON:  Marciano Sequin. M.D.  ASSISTANT: Vance Peper, PA (present and scrubbed throughout the case, critical for assistance with exposure, retraction, instrumentation, and closure)  ANESTHESIA: spinal  ESTIMATED BLOOD LOSS: 400 mL  FLUIDS REPLACED: 1000 mL of crystalloid  DRAINS: 2 medium drains to a Hemovac reservoir  IMPLANTS UTILIZED: DePuy 13.5 mm large stature AML femoral stem, 54 mm OD Pinnacle 100 acetabular component, neutral Pinnacle Altrx polyethylene insert, and a 36 mm M-SPEC +1.5 mm hip ball  INDICATIONS FOR SURGERY: Arthur Jones is a 68 y.o. year old male with a long history of  progressive hip and groin  pain. X-rays demonstrated severe degenerative changes. The patient had not seen any significant improvement despite conservative nonsurgical intervention. After discussion of the risks and benefits of surgical intervention, the patient expressed understanding of the risks benefits and agree with plans for total hip arthroplasty.   The risks, benefits, and alternatives were discussed at length including but not limited to the risks of infection, bleeding, nerve injury, stiffness, blood clots, the need for revision surgery, limb length inequality, dislocation, cardiopulmonary complications, among others, and they were willing to proceed. Patient tolerated the surgery well. No complications .Patient was taken to PACU where she was stabilized and then transferred to the orthopedic floor.  Patient started on Lovenox 30 mg q 12 hrs. Foot pumps applied bilaterally at 80 mm hgb. Heels elevated off bed with rolled towels. No evidence of DVT. Calves non tender. Negative Homan. Physical therapy started on day #1 for gait training and transfer with OT starting on  day #1 for ADL and assisted devices. Patient has done well with therapy. Ambulated greater than 200 feet upon being discharged.  Was able to ascend and descend 4 steps safely and independently  Patient's IV And Foley were discontinued on day #1 with Hemovac being discontinued on day #2. Dressing was changed on day 2 prior to patient being discharged   He was given perioperative antibiotics:  Anti-infectives (From admission, onward)   Start     Dose/Rate Route Frequency Ordered Stop   01/18/19 1400  ceFAZolin (ANCEF) IVPB 2g/100 mL premix     2 g 200 mL/hr over 30 Minutes Intravenous Every 6 hours 01/18/19 1154 01/19/19 1359  01/18/19 0610  ceFAZolin (ANCEF) 2-4 GM/100ML-% IVPB    Note to Pharmacy:  Dewayne Hatch   : cabinet override      01/18/19 0610 01/18/19 0740   01/18/19 0600  ceFAZolin (ANCEF) IVPB 2g/100 mL  premix     2 g 200 mL/hr over 30 Minutes Intravenous On call to O.R. 01/18/19 5573 01/18/19 0810    .  He was fitted with AV 1 compression foot pump devices, instructed on heel pumps, early ambulation, and fitted with TED stockings bilaterally for DVT prophylaxis.  He benefited maximally from the hospital stay and there were no complications.    Recent vital signs:  Vitals:   01/19/19 0511 01/19/19 0718  BP: 140/87 137/89  Pulse: 72 71  Resp: 19 17  Temp: 97.9 F (36.6 C) 97.7 F (36.5 C)  SpO2: 97% 96%    Recent laboratory studies:  Lab Results  Component Value Date   HGB 15.7 01/14/2019   HGB 16.1 07/09/2017   Lab Results  Component Value Date   WBC 8.7 01/14/2019   PLT 255 01/14/2019   Lab Results  Component Value Date   INR 0.9 01/14/2019   Lab Results  Component Value Date   NA 142 01/14/2019   K 3.4 (L) 01/14/2019   CL 106 01/14/2019   CO2 25 01/14/2019   BUN 15 01/14/2019   CREATININE 1.02 01/14/2019   GLUCOSE 99 01/14/2019    Discharge Medications:   Allergies as of 01/19/2019   No Known Allergies     Medication List    STOP taking these medications   aspirin EC 81 MG tablet   ibuprofen 200 MG tablet Commonly known as:  ADVIL     TAKE these medications   amLODipine 10 MG tablet Commonly known as:  NORVASC Take 10 mg by mouth at bedtime.   enoxaparin 40 MG/0.4ML injection Commonly known as:  LOVENOX Inject 0.4 mLs (40 mg total) into the skin daily for 14 days. Start taking on:  Jan 21, 2019   finasteride 5 MG tablet Commonly known as:  PROSCAR TAKE 1 TABLET DAILY   fluticasone 50 MCG/ACT nasal spray Commonly known as:  FLONASE Place 1 spray into the nose daily as needed for allergies.   methocarbamol 750 MG tablet Commonly known as:  ROBAXIN Take 750 mg by mouth 2 (two) times a day.   oxyCODONE 5 MG immediate release tablet Commonly known as:  Oxy IR/ROXICODONE Take 1 tablet (5 mg total) by mouth every 4 (four) hours as  needed for moderate pain (pain score 4-6).   sildenafil 50 MG tablet Commonly known as:  VIAGRA Take 50 mg as needed by mouth for erectile dysfunction.   simvastatin 40 MG tablet Commonly known as:  ZOCOR Take 40 mg by mouth every evening.   telmisartan-hydrochlorothiazide 40-12.5 MG tablet Commonly known as:  MICARDIS HCT Take 1 tablet by mouth daily.   traMADol 50 MG tablet Commonly known as:  ULTRAM Take 1-2 tablets (50-100 mg total) by mouth every 6 (six) hours as needed for moderate pain.            Durable Medical Equipment  (From admission, onward)         Start     Ordered   01/18/19 1155  DME Walker rolling  Once    Question:  Patient needs a walker to treat with the following condition  Answer:  S/P total hip arthroplasty   01/18/19 1154   01/18/19 1155  DME  Bedside commode  Once    Question:  Patient needs a bedside commode to treat with the following condition  Answer:  S/P total hip arthroplasty   01/18/19 1154          Diagnostic Studies: Dg Hip Port Unilat With Pelvis 1v Right  Result Date: 01/18/2019 CLINICAL DATA:  Status post total hip replacement. EXAM: DG HIP (WITH OR WITHOUT PELVIS) 3  V PORT RIGHT COMPARISON:  None. FINDINGS: Status post RIGHT THA. Femoral and acetabular components appear satisfactory. Surgical drain in good position. IMPRESSION: Satisfactory position and alignment. Electronically Signed   By: Staci Righter M.D.   On: 01/18/2019 13:09    Disposition:   Discharge Instructions    Diet - low sodium heart healthy   Complete by:  As directed    Increase activity slowly   Complete by:  As directed       Follow-up Information    Hooten, Laurice Record, MD On 03/02/2019.   Specialty:  Orthopedic Surgery Why:  at 1:30pm Contact information: [place] [phone]            Signed: Watt Climes 01/19/2019, 7:41 AM

## 2019-01-19 NOTE — Progress Notes (Signed)
OT Cancellation Note  Patient Details Name: Arthur Jones MRN: [id number] DOB: 01-13-1951   Cancelled Treatment:    Reason Eval/Treat Not Completed: Other (comment). Consult received, chart reviewed. Pt with nursing for pt care upon initial attempt. Will re-attempt OT evaluation at later time.   Jeni Salles, MPH, MS, OTR/L ascom [phone] 01/19/19, 9:30 AM

## 2019-01-19 NOTE — Progress Notes (Signed)
Physical Therapy Treatment Patient Details Name: Arthur Jones MRN: [id number] DOB: Jul 03, 1951 Today's Date: 01/19/2019    History of Present Illness 68 y/o male status post R THR, posterior approach, WBAT (01/18/2019)    PT Comments    Pt did well with ambulation and stair negotiation, showed great effort and strength with LE exercises, still needing some cuing/explanation with hip precautions.  Overall pt did very well and has achieved/surpassed expected POD1 expectations, has had minimal and is is eager to go back home.     Follow Up Recommendations  Home health PT     Equipment Recommendations       Recommendations for Other Services       Precautions / Restrictions Precautions Precautions: Fall;Posterior Hip Precaution Comments: Pt still needing cuing with precautions Restrictions Weight Bearing Restrictions: Yes RLE Weight Bearing: Weight bearing as tolerated    Mobility  Bed Mobility Overal bed mobility: Modified Independent Bed Mobility: Sit to Supine       Sit to supine: Supervision   General bed mobility comments: Pt able to maintain hip precautions while relatively easily lifting LEs into bed  Transfers Overall transfer level: Needs assistance Equipment used: Rolling walker (2 wheeled) Transfers: Sit to/from Stand Sit to Stand: Supervision         General transfer comment: Pt continues to need light cuing to insure avoiding too much hip flexion and proper UE use, but able to rise from/return to sitting w/o direct assist  Ambulation/Gait Ambulation/Gait assistance: Min guard Gait Distance (Feet): 250 Feet Assistive device: Rolling walker (2 wheeled)       General Gait Details: Pt again with good confidence, cues to slow and focus more on hip/knee mechanics with R LE to even cadence.  Pt ultimately did well and showed good improvements with these changes, minimal reliance on UEs/walker with increased time, warm up   Stairs Stairs: Yes Stairs  assistance: Supervision Stair Management: No rails;Backwards;With walker Number of Stairs: 3 General stair comments: Pt was able to negotiate up/down steps without direct assist, only VCs and assist with walker stabilization on steps.  Pt with good understanding and execution.   Wheelchair Mobility    Modified Rankin (Stroke Patients Only)       Balance Overall balance assessment: Modified Independent Sitting-balance support: No upper extremity supported;Feet supported Sitting balance-Leahy Scale: Good     Standing balance support: Bilateral upper extremity supported Standing balance-Leahy Scale: Good                              Cognition Arousal/Alertness: Awake/alert Behavior During Therapy: WFL for tasks assessed/performed Overall Cognitive Status: Within Functional Limits for tasks assessed                                        Exercises Total Joint Exercises Ankle Circles/Pumps: Strengthening;10 reps Quad Sets: Strengthening;15 reps Gluteal Sets: AROM;10 reps Short Arc Quad: Strengthening;15 reps Heel Slides: Strengthening;15 reps(with resited leg extensions) Hip ABduction/ADduction: Strengthening;15 reps Straight Leg Raises: AROM;Strengthening;15 reps(light resist at top of each rep) Knee Flexion: Strengthening;15 reps    General Comments        Pertinent Vitals/Pain Pain Assessment: 0-10 Pain Score: 3     Home Living                      Prior Function  PT Goals (current goals can now be found in the care plan section) Progress towards PT goals: Progressing toward goals    Frequency    BID      PT Plan Current plan remains appropriate    Co-evaluation              AM-PAC PT "6 Clicks" Mobility   Outcome Measure  Help needed turning from your back to your side while in a flat bed without using bedrails?: None Help needed moving from lying on your back to sitting on the side of a flat  bed without using bedrails?: None Help needed moving to and from a bed to a chair (including a wheelchair)?: None Help needed standing up from a chair using your arms (e.g., wheelchair or bedside chair)?: A Little Help needed to walk in hospital room?: A Little Help needed climbing 3-5 steps with a railing? : A Little 6 Click Score: 21    End of Session Equipment Utilized During Treatment: Gait belt Activity Tolerance: Patient tolerated treatment well Patient left: in chair;with call bell/phone within reach;with chair alarm set Nurse Communication: Mobility status PT Visit Diagnosis: Muscle weakness (generalized) (M62.81);Difficulty in walking, not elsewhere classified (R26.2);Pain Pain - Right/Left: Right Pain - part of body: Hip     Time: 3295-1884 PT Time Calculation (min) (ACUTE ONLY): 28 min  Charges:  $Gait Training: 8-22 mins $Therapeutic Exercise: 8-22 mins                     Kreg Shropshire, DPT 01/19/2019, 3:20 PM

## 2019-01-19 NOTE — Evaluation (Signed)
Occupational Therapy Evaluation Patient Details Name: Arthur Jones MRN: [id number] DOB: Apr 07, 1951 Today's Date: 01/19/2019    History of Present Illness 68 y/o male status post R THR, posterior approach, WBAT (01/18/2019)   Clinical Impression   Pt seen for OT evaluation this date, POD#1 from above surgery. Pt was independent in all ADL prior to surgery and active, however becoming increasingly limited due to R hip pain. Pt is eager to return to PLOF with less pain and improved safety and independence. Pt currently requires min-mod assist for LB dressing and bathing while in seated position due to pain and limited AROM of R hip in order to maintain precautions. Pt able to recall 2/3 posterior total hip precautions at start of session and unable to verbalize how to implement during ADL and mobility. Pt instructed in posterior total hip precautions and how to implement, self care skills, falls prevention strategies, home/routines modifications, DME/AE for LB bathing and dressing tasks, compression stocking mgt strategies, and car transfer techniques. At end of session, pt able to recall 3/3 posterior total hip precautions. Pt would benefit from additional skilled OT instruction while in the hospital with emphasis on self care skills and techniques to help maintain precautions with or without assistive devices to support recall and carryover prior to discharge. Do not anticipate skilled OT needs upon discharge.    Follow Up Recommendations  No OT follow up    Equipment Recommendations  None recommended by OT    Recommendations for Other Services       Precautions / Restrictions Precautions Precautions: Fall;Posterior Hip Precaution Booklet Issued: No Precaution Comments: pt able to verbalize 2/3 at start of session, 3/3 at end of session after additional instruction Restrictions Weight Bearing Restrictions: Yes RLE Weight Bearing: Weight bearing as tolerated      Mobility Bed  Mobility     General bed mobility comments: deferred, up in recliner at start and end of session  Transfers Overall transfer level: Needs assistance Equipment used: Rolling walker (2 wheeled) Transfers: Sit to/from Stand Sit to Stand: Supervision         General transfer comment: Cues for hand placement and precautions, good effort and no need for phyiscal assist    Balance Overall balance assessment: Modified Independent Sitting-balance support: No upper extremity supported;Feet supported Sitting balance-Leahy Scale: Good     Standing balance support: Bilateral upper extremity supported Standing balance-Leahy Scale: Good Standing balance comment: Pt able to maintian static balance w/o AD, reliant on UEs during dynamic tasks                           ADL either performed or assessed with clinical judgement   ADL Overall ADL's : Needs assistance/impaired Eating/Feeding: Independent   Grooming: Standing;Supervision/safety   Upper Body Bathing: Sitting;Supervision/ safety;Set up   Lower Body Bathing: Sit to/from stand;Moderate assistance;Minimal assistance   Upper Body Dressing : Sitting;Set up;Supervision/safety   Lower Body Dressing: Sit to/from stand;Minimal assistance;Moderate assistance   Toilet Transfer: BSC;Min guard;Ambulation;RW;Cueing for safety Toilet Transfer Details (indicate cue type and reason): cues for THPs                 Vision Baseline Vision/History: Wears glasses Wears Glasses: At all times Patient Visual Report: No change from baseline Vision Assessment?: No apparent visual deficits     Perception     Praxis      Pertinent Vitals/Pain Pain Assessment: 0-10 Pain Score: 3  Pain Location: R  hip Pain Descriptors / Indicators: Aching;Discomfort Pain Intervention(s): Limited activity within patient's tolerance;Monitored during session;Repositioned;Ice applied     Hand Dominance     Extremity/Trunk Assessment Upper  Extremity Assessment Upper Extremity Assessment: Overall WFL for tasks assessed   Lower Extremity Assessment Lower Extremity Assessment: RLE deficits/detail RLE Deficits / Details: expected post-op pain and strength/ROM deficits   Cervical / Trunk Assessment Cervical / Trunk Assessment: Normal   Communication Communication Communication: No difficulties   Cognition Arousal/Alertness: Awake/alert Behavior During Therapy: WFL for tasks assessed/performed Overall Cognitive Status: Within Functional Limits for tasks assessed                                     General Comments       Exercises Other Exercises Other Exercises: pt instructed in posterior THPs with verbal instruction and visual demo with teach back to support recall and carryover Other Exercises: pt instructed in falls prevention, home/routines modifications, and AE/DME to support indep/safety with ADL tasks and maintain precautions   Shoulder Instructions      Home Living Family/patient expects to be discharged to:: Private residence Living Arrangements: Spouse/significant other Available Help at Discharge: Family;Available 24 hours/day Type of Home: House Home Access: Stairs to enter [employer] of Steps: 2   Home Layout: Two level(R ascending rail)     Bathroom Shower/Tub: Occupational psychologist: Handicapped height     Home Equipment: Environmental consultant - 2 wheels;Cane - single point;Shower seat - built in;Bedside commode;Adaptive equipment(walking stick; friend is letting pt borrow a BSC) Adaptive Equipment: Reacher Additional Comments: bed/bath on main level of home; stationary bike and home office on upper level of home      Prior Functioning/Environment Level of Independence: Independent        Comments: Indep without assist device as baseline mobility, active hiker and biker; recent use of RW or SPC for mobility due to progressive difficulty with R hip        OT  Problem List: Decreased knowledge of precautions;Decreased strength;Pain;Decreased knowledge of use of DME or AE      OT Treatment/Interventions: Self-care/ADL training;Therapeutic exercise;Therapeutic activities;DME and/or AE instruction;Patient/family education    OT Goals(Current goals can be found in the care plan section) Acute Rehab OT Goals Patient Stated Goal: to get back to hiking OT Goal Formulation: With patient Time For Goal Achievement: 02/02/19 Potential to Achieve Goals: Good ADL Goals Pt Will Perform Lower Body Dressing: with adaptive equipment;with modified independence;sit to/from stand(maintaining posterior THPs) Pt Will Transfer to Toilet: with supervision;ambulating(BSC over toilet, LRAD for amb, maintaining posterior THPs) Additional ADL Goal #1: Pt will correctly verbalize 100% of posterior total hip precautions and how to implement during LB ADL Tasks with no verbal cues for safety/technique. Additional ADL Goal #2: Pt will independently instruct family/caregiver in compression stocking mgt including donning/doffing, positioning, and wear schedule to maximize safety and minimize risk of blood clots.  OT Frequency: Min 1X/week   Barriers to D/C:            Co-evaluation              AM-PAC OT "6 Clicks" Daily Activity     Outcome Measure Help from another person eating meals?: None Help from another person taking care of personal grooming?: None Help from another person toileting, which includes using toliet, bedpan, or urinal?: A Little Help from another person bathing (including washing, rinsing, drying)?:  A Little Help from another person to put on and taking off regular upper body clothing?: None Help from another person to put on and taking off regular lower body clothing?: A Little 6 Click Score: 21   End of Session    Activity Tolerance: Patient tolerated treatment well Patient left: in chair;with call bell/phone within reach;with chair alarm  set  OT Visit Diagnosis: Other abnormalities of gait and mobility (R26.89);Pain Pain - Right/Left: Right Pain - part of body: Hip                Time: 1010-1036 OT Time Calculation (min): 26 min Charges:  OT General Charges $OT Visit: 1 Visit OT Evaluation $OT Eval Low Complexity: 1 Low OT Treatments $Self Care/Home Management : 23-37 mins  Jeni Salles, MPH, MS, OTR/L ascom [phone] 01/19/19, 10:49 AM

## 2019-01-20 NOTE — Progress Notes (Signed)
Physical Therapy Treatment Patient Details Name: Arthur Jones MRN: [id number] DOB: 01/08/1951 Today's Date: 01/20/2019    History of Present Illness 68 y/o male status post R THR, posterior approach, WBAT (01/18/2019)    PT Comments    Patient with good progress towards all therapy goals, having met all goals necessary to facilitate safe discharge to home environment.  Able to complete bed mobility, sit/stand, basic transfers and gait (200') with RW (fair cadence, 10' walk time, 7-8 seconds), cga/close sup.  Intermittent cuing for recall of THPs and adherence with functional activities; voices understanding of information once reviewed._0 Patient comfortable with upcoming discharge; no further questions/concerns at this time.    Follow Up Recommendations  Home health PT     Equipment Recommendations  3in1 (PT)    Recommendations for Other Services       Precautions / Restrictions Precautions Precautions: Fall;Posterior Hip Precaution Booklet Issued: No Precaution Comments: Indep recalls 2/3 precautions; 3/3 with cuing from therapist Restrictions Weight Bearing Restrictions: Yes RLE Weight Bearing: Weight bearing as tolerated    Mobility  Bed Mobility Overal bed mobility: Modified Independent Bed Mobility: Supine to Sit              Transfers Overall transfer level: Needs assistance Equipment used: Rolling walker (2 wheeled) Transfers: Sit to/from Stand Sit to Stand: Supervision         General transfer comment: initial cuing for hand placement, but improved performance with repetition; requires UE support to guide stand to sit, control eccentric lowering  Ambulation/Gait Ambulation/Gait assistance: Supervision Gait Distance (Feet): 200 Feet Assistive device: Rolling walker (2 wheeled)   Gait velocity: 10' walk time, 7-8 seconds   General Gait Details: step to progressing to step throughout; min cuing for R heel strike/toe off and R TKE in loading,  mechanics/cadence improving with distance   Stairs         General stair comments: patient declined stairs this date; feels comfortable after performance previous date   Wheelchair Mobility    Modified Rankin (Stroke Patients Only)       Balance Overall balance assessment: Needs assistance Sitting-balance support: No upper extremity supported;Feet supported Sitting balance-Leahy Scale: Good     Standing balance support: Bilateral upper extremity supported Standing balance-Leahy Scale: Good Standing balance comment: good WBing bilat LEs, minimal WBing bilat UEs on RW; limited ability for weight shift/excursion outside immediate BOS                            Cognition Arousal/Alertness: Awake/alert Behavior During Therapy: WFL for tasks assessed/performed Overall Cognitive Status: Within Functional Limits for tasks assessed                                 General Comments: mild confusion at times (med related?)      Exercises Other Exercises Other Exercises: Upper body dressing, set up/sup; lower body dressing, min assist with RW. Min cuing for dressing technique and adherence to THPs. Other Exercises: Verbally reviewed technique for car transfer; patient voiced understanding/agreement. Other Exercises: Issued and reviewed packet for HEP, THP precautions; patient voiced understanding and agreemenet.    General Comments        Pertinent Vitals/Pain Pain Assessment: 0-10 Pain Score: 2  Pain Location: R hip Pain Descriptors / Indicators: Aching;Discomfort Pain Intervention(s): Limited activity within patient's tolerance;Repositioned;Monitored during session;Premedicated before session    Home Living  Prior Function            PT Goals (current goals can now be found in the care plan section) Acute Rehab PT Goals Patient Stated Goal: to get back to hiking PT Goal Formulation: With patient Time For Goal  Achievement: 02/01/19 Potential to Achieve Goals: Good Progress towards PT goals: Progressing toward goals    Frequency    BID      PT Plan Current plan remains appropriate    Co-evaluation              AM-PAC PT "6 Clicks" Mobility   Outcome Measure  Help needed turning from your back to your side while in a flat bed without using bedrails?: None Help needed moving from lying on your back to sitting on the side of a flat bed without using bedrails?: None Help needed moving to and from a bed to a chair (including a wheelchair)?: None Help needed standing up from a chair using your arms (e.g., wheelchair or bedside chair)?: A Little Help needed to walk in hospital room?: A Little Help needed climbing 3-5 steps with a railing? : A Little 6 Click Score: 21    End of Session Equipment Utilized During Treatment: Gait belt Activity Tolerance: Patient tolerated treatment well Patient left: in chair;with call bell/phone within reach;with chair alarm set Nurse Communication: Mobility status PT Visit Diagnosis: Muscle weakness (generalized) (M62.81);Difficulty in walking, not elsewhere classified (R26.2);Pain Pain - Right/Left: Right Pain - part of body: Hip     Time: 0917-0940 PT Time Calculation (min) (ACUTE ONLY): 23 min  Charges:  $Gait Training: 8-22 mins $Therapeutic Activity: 8-22 mins                     Kristen H. Brown, PT, DPT, NCS 01/20/19, 10:41 AM [phone]    

## 2019-01-20 NOTE — Progress Notes (Signed)
Went over AVS including detailed instructions on what medications he needs to stop. Pt understood and had no questions.

## 2019-01-20 NOTE — Plan of Care (Signed)
  Problem: Education: Goal: Knowledge of General Education information will improve Description Including pain rating scale, medication(s)/side effects and non-pharmacologic comfort measures 01/20/2019 1111 by Maye Hides, RN Outcome: Completed/Met 01/20/2019 0906 by Maye Hides, RN Outcome: Adequate for Discharge   Problem: Health Behavior/Discharge Planning: Goal: Ability to manage health-related needs will improve 01/20/2019 1111 by Maye Hides, RN Outcome: Completed/Met 01/20/2019 0906 by Maye Hides, RN Outcome: Adequate for Discharge   Problem: Clinical Measurements: Goal: Ability to maintain clinical measurements within normal limits will improve 01/20/2019 1111 by Maye Hides, RN Outcome: Completed/Met 01/20/2019 0906 by Maye Hides, RN Outcome: Adequate for Discharge Goal: Will remain free from infection 01/20/2019 1111 by Maye Hides, RN Outcome: Completed/Met 01/20/2019 0906 by Maye Hides, RN Outcome: Adequate for Discharge Goal: Diagnostic test results will improve 01/20/2019 1111 by Maye Hides, RN Outcome: Completed/Met 01/20/2019 0906 by Maye Hides, RN Outcome: Adequate for Discharge Goal: Respiratory complications will improve 01/20/2019 1111 by Maye Hides, RN Outcome: Completed/Met 01/20/2019 0906 by Maye Hides, RN Outcome: Adequate for Discharge Goal: Cardiovascular complication will be avoided 01/20/2019 1111 by Maye Hides, RN Outcome: Completed/Met 01/20/2019 0906 by Maye Hides, RN Outcome: Adequate for Discharge   Problem: Activity: Goal: Risk for activity intolerance will decrease 01/20/2019 1111 by Maye Hides, RN Outcome: Completed/Met 01/20/2019 0906 by Maye Hides, RN Outcome: Adequate for Discharge   Problem: Nutrition: Goal: Adequate nutrition will be maintained 01/20/2019 1111 by Maye Hides, RN Outcome: Completed/Met 01/20/2019 0906 by Maye Hides,  RN Outcome: Adequate for Discharge   Problem: Coping: Goal: Level of anxiety will decrease 01/20/2019 1111 by Maye Hides, RN Outcome: Completed/Met 01/20/2019 0906 by Maye Hides, RN Outcome: Adequate for Discharge   Problem: Elimination: Goal: Will not experience complications related to bowel motility 01/20/2019 1111 by Maye Hides, RN Outcome: Completed/Met 01/20/2019 0906 by Maye Hides, RN Outcome: Adequate for Discharge Goal: Will not experience complications related to urinary retention 01/20/2019 1111 by Maye Hides, RN Outcome: Completed/Met 01/20/2019 0906 by Maye Hides, RN Outcome: Adequate for Discharge   Problem: Pain Managment: Goal: General experience of comfort will improve 01/20/2019 1111 by Maye Hides, RN Outcome: Completed/Met 01/20/2019 0906 by Maye Hides, RN Outcome: Adequate for Discharge   Problem: Safety: Goal: Ability to remain free from injury will improve 01/20/2019 1111 by Maye Hides, RN Outcome: Completed/Met 01/20/2019 0906 by Maye Hides, RN Outcome: Adequate for Discharge   Problem: Skin Integrity: Goal: Risk for impaired skin integrity will decrease 01/20/2019 1111 by Maye Hides, RN Outcome: Completed/Met 01/20/2019 0906 by Maye Hides, RN Outcome: Adequate for Discharge

## 2019-01-20 NOTE — TOC Progression Note (Signed)
Transition of Care [facility]) - Progression Note    Patient Details  Name: Arthur Jones MRN: [id number] Date of Birth: 1951-08-25  Transition [facility] CM/SW [place], RN Phone Number: 01/20/2019, 9:29 AM  Clinical Narrative:    Patient states that his walker at home is a rollator and he will need a RW, notified Brad with [employer], he will bring up to the patient before DC   Expected Discharge Plan: [place] Barriers to Discharge: Barriers Resolved  Expected Discharge Plan and Services Expected Discharge Plan: [place]   Discharge Planning Services: CM Consult Post Acute Care Choice: [place] arrangements for the past 2 months: Single Family Home Expected Discharge Date: 01/20/19                 DME Agency: [facility] Date DME Agency Contacted: 01/18/19 Time DME Agency Contacted: [place]: [facility] (formerly [employer]) Date [place]: 01/18/19 Time [place]: [place] Representative spoke with at [place]: [place] ([place]) Interventions    Readmission Risk Interventions No flowsheet data found.

## 2019-01-20 NOTE — Plan of Care (Signed)

## 2019-01-20 NOTE — Progress Notes (Signed)
   Subjective: 2 Days Post-Op Procedure(s) (LRB): TOTAL HIP ARTHROPLASTY - RIGHT (Right) Patient reports pain as 1 on 0-10 scale.   Patient is well, and has had no acute complaints or problems Patient did extremely well with physical therapy on day 1.  Was able to meet all criteria for going home. Plan is to go Home after hospital stay. no nausea and no vomiting Patient voicing no complaints Patient denies any chest pains or shortness of breath.  Objective: Vital signs in last 24 hours: Temp:  [97.7 F (36.5 C)-98 F (36.7 C)] 98 F (36.7 C) (05/20 0328) Pulse Rate:  [71-99] 99 (05/20 0328) Resp:  [17-19] 19 (05/20 0328) BP: (122-147)/(74-87) 147/87 (05/20 0328) SpO2:  [94 %-97 %] 94 % (05/20 0328) well approximated incision Heels are non tender and elevated off the bed using rolled towels Intake/Output from previous day: 05/19 0701 - 05/20 0700 In: 120 [P.O.:120] Out: 670 [Urine:500; Drains:170] Intake/Output this shift: No intake/output data recorded.  No results for input(s): HGB in the last 72 hours. No results for input(s): WBC, RBC, HCT, PLT in the last 72 hours. No results for input(s): NA, K, CL, CO2, BUN, CREATININE, GLUCOSE, CALCIUM in the last 72 hours. No results for input(s): LABPT, INR in the last 72 hours.  EXAM General - Patient is Alert, Appropriate and Oriented Extremity - Neurologically intact Neurovascular intact Sensation intact distally Intact pulses distally Dorsiflexion/Plantar flexion intact No cellulitis present Compartment soft Dressing - dressing C/D/I Motor Function - intact, moving foot and toes well on exam.    Past Medical History:  Diagnosis Date  . Erectile dysfunction   . High cholesterol   . Hypertension   . Urinary retention     Assessment/Plan: 2 Days Post-Op Procedure(s) (LRB): TOTAL HIP ARTHROPLASTY - RIGHT (Right) Active Problems:   H/O total hip arthroplasty  Estimated body mass index is 32.32 kg/m as calculated  from the following:   Height as of this encounter: 5' 10.98" (1.803 m).   Weight as of this encounter: 105.1 kg. Up with therapy Discharge home with home health  Labs: None DVT Prophylaxis - Lovenox, Foot Pumps and TED hose Weight-Bearing as tolerated to right leg Hemovac was discontinued today.  Ends of the drain appeared to be intact  Kumiko Fishman R. [place] 01/20/2019, 7:41 AM

## 2019-01-20 NOTE — TOC Transition Note (Signed)
Transition of Care [facility]) - CM/SW Discharge Note   Patient Details  Name: Arthur Jones MRN: [id number] Date of Birth: 07/16/1951  Transition of [facility]) CM/SW Contact:  Su Hilt, RN Phone Number: 01/20/2019, 8:35 AM   Clinical Narrative:    Patient to DC home with family, Will have [facility] PT, Lovenox price provided to the patient, no DME needs.    Final next level of care: [place] Barriers to Discharge: Barriers Resolved   Patient Goals and CMS Choice Patient states their goals for this hospitalization and ongoing recovery are:: go home get well so he can go hiking CMS Medicare.gov Compare Post Acute Care list provided to:: Patient Choice offered to / list presented to : Patient  Discharge Placement                       Discharge Plan and Services   Discharge Planning Services: CM Consult Post Acute Care Choice: Home Health            DME Agency: [facility]) Date DME Agency Contacted: 01/18/19 Time DME Agency Contacted: [place]: [facility] (formerly [employer]) Date [place]: 01/18/19 Time [place]: [place] Representative spoke with at [place]: [place] ([place]) Interventions     Readmission Risk Interventions No flowsheet data found.

## 2019-01-20 NOTE — Progress Notes (Signed)
Pt was DC home.

## 2019-07-08 ENCOUNTER — Other Ambulatory Visit: Payer: Self-pay

## 2019-07-08 ENCOUNTER — Ambulatory Visit: Payer: Medicare Other | Admitting: Urology

## 2019-07-08 VITALS — BP 148/96 | HR 89 | Ht 72.0 in | Wt 221.0 lb

## 2019-07-08 DIAGNOSIS — N138 Other obstructive and reflux uropathy: Secondary | ICD-10-CM

## 2019-07-08 DIAGNOSIS — N401 Enlarged prostate with lower urinary tract symptoms: Secondary | ICD-10-CM

## 2019-07-08 DIAGNOSIS — R339 Retention of urine, unspecified: Secondary | ICD-10-CM | POA: Diagnosis not present

## 2019-07-08 LAB — BLADDER SCAN AMB NON-IMAGING: Scan Result: 72

## 2019-07-08 MED ORDER — FINASTERIDE 5 MG PO TABS: 5.0000 mg | ORAL_TABLET | Freq: Every day | ORAL | 3 refills | Status: DC

## 2019-07-08 NOTE — Progress Notes (Signed)
   07/08/2019 2:46 PM   [name] [birth year] [id number]  Reason for visit: Follow up BPH, history of prostatitis  HPI: I saw Mr. Rimando back in urology clinic for yearly follow-up today.  He is a healthy 68 year old male with a history of prostatitis and 1 episode of acute urinary retention over 2 years ago.  He has minimal urinary symptoms at baseline that are well controlled on finasteride.  He has nocturia 1-2 times per night that is minimally bothersome.  PSA within the last year was normal at 0.66.  He denies any complaints today and feels like he is doing very well.  He recently had a hip replacement, but is already back riding bikes again.  He denies any gross hematuria or infections over the last year.   ROS: Please see flowsheet from today's date for complete review of systems.  Physical Exam: BP (!) 148/96   Pulse 89   Ht 6' (1.829 m)   Wt 221 lb (100.2 kg)   BMI 29.97 kg/m    Constitutional:  Alert and oriented, No acute distress. Respiratory: Normal respiratory effort, no increased work of breathing. GI: Abdomen is soft, nontender, nondistended, no abdominal masses Skin: No rashes, bruises or suspicious lesions. Neurologic: Grossly intact, no focal deficits, moving all 4 extremities. Psychiatric: Normal mood and affect   Assessment & Plan:   In summary, the patient is a very healthy 68 year old male with a history of BPH and one episode of urinary retention, as well as prostatitis treated with a month-long course of antibiotics who has very minimal urinary symptoms that are very well controlled on finasteride.  We discussed his reassuring PSA at length today, and the AUA guidelines that do not recommend routine PSA screening in men over age 43.  He continues to be content with his urinary symptoms on finasteride and is not interested in an outlet procedure.  RTC 1 year with PVR Continue finasteride  A total of 15 minutes were spent face-to-face with the  patient, greater than 50% was spent in patient education, counseling, and coordination of care regarding BPH and urinary symptoms.  Billey Co, [facility] [address], [place], [place] [address] [phone]

## 2019-09-22 ENCOUNTER — Ambulatory Visit: Payer: Medicare PPO | Attending: Internal Medicine

## 2019-09-22 DIAGNOSIS — Z23 Encounter for immunization: Secondary | ICD-10-CM | POA: Insufficient documentation

## 2019-09-22 NOTE — Progress Notes (Signed)
   Covid-19 Vaccination Clinic  Name:  EDWEN ROCCHIO    MRN: [id number] DOB: Sep 13, 1950  09/22/2019  Mr. Feuerhelm was observed post Covid-19 immunization for 15 minutes without incidence. He was provided with Vaccine Information Sheet and instruction to access the V-Safe system.   Mr. Haggstrom was instructed to call 911 with any severe reactions post vaccine: Marland Kitchen Difficulty breathing  . Swelling of your face and throat  . A fast heartbeat  . A bad rash all over your body  . Dizziness and weakness    Immunizations Administered    Name Date Dose VIS Date Route   Pfizer COVID-19 Vaccine 09/22/2019  4:06 PM 0.3 mL 08/13/2019 Intramuscular   Manufacturer: [place]   Lot: [id number]   [place]: [id number]

## 2019-10-10 ENCOUNTER — Ambulatory Visit: Payer: Medicare PPO | Attending: Internal Medicine

## 2019-10-10 DIAGNOSIS — Z23 Encounter for immunization: Secondary | ICD-10-CM | POA: Insufficient documentation

## 2019-10-10 NOTE — Progress Notes (Signed)
   Covid-19 Vaccination Clinic  Name:  Arthur Jones    MRN: [id number] DOB: 1950/11/05  10/10/2019  [name] was observed post Covid-19 immunization for 30 minutes based on pre-vaccination screening without incidence. He was provided with Vaccine Information Sheet and instruction to access the V-Safe system.   [name] was instructed to call 911 with any severe reactions post vaccine: Marland Kitchen Difficulty breathing  . Swelling of your face and throat  . A fast heartbeat  . A bad rash all over your body  . Dizziness and weakness    Immunizations Administered    Name Date Dose VIS Date Route   Pfizer COVID-19 Vaccine 10/10/2019  9:11 AM 0.3 mL 08/13/2019 Intramuscular   Manufacturer: [place]   Lot: [id number]   [place]: [id number]

## 2020-03-29 ENCOUNTER — Other Ambulatory Visit: Payer: Self-pay

## 2020-03-29 ENCOUNTER — Ambulatory Visit: Payer: Medicare PPO | Admitting: Dermatology

## 2020-03-29 DIAGNOSIS — L720 Epidermal cyst: Secondary | ICD-10-CM

## 2020-03-29 DIAGNOSIS — L820 Inflamed seborrheic keratosis: Secondary | ICD-10-CM

## 2020-03-29 MED ORDER — DOXYCYCLINE HYCLATE 100 MG PO TABS: ORAL_TABLET | ORAL | 0 refills | Status: DC

## 2020-03-29 NOTE — Progress Notes (Signed)
   Follow-Up Visit   Subjective  Arthur Jones is a 69 y.o. male who presents for the following: lesion (on the L upper eyelid - has been there for years). He also has a knot on his back that is become sore recently he would like this evaluated.  The following portions of the chart were reviewed this encounter and updated as appropriate:  Tobacco  Allergies  Meds  Problems  Med Hx  Surg Hx  Fam Hx     Review of Systems:  No other skin or systemic complaints except as noted in HPI or Assessment and Plan.  Objective  Well appearing patient in no apparent distress; mood and affect are within normal limits.  A focused examination was performed including the face and back. Relevant physical exam findings are noted in the Assessment and Plan.  Objective  Left Upper Eyelid Margin x 2 (2): Erythematous keratotic or waxy stuck-on papule or plaque.   Objective  R mid back: 2.5 cm firm SQ nodule with erythema  Assessment & Plan  Inflamed seborrheic keratosis (2) Left Upper Eyelid Margin x 2  symptomatic  Destruction of lesion - Left Upper Eyelid Margin x 2 Complexity: simple   Destruction method comment:  Shave removal and electrodesiccation Informed consent: discussed and consent obtained   Timeout:  patient name, date of birth, surgical site, and procedure verified Procedure prep:  Patient was prepped and draped in usual sterile fashion Prep type:  Isopropyl alcohol Anesthesia: the lesion was anesthetized in a standard fashion   Anesthetic:  1% lidocaine w/ epinephrine 1-100,000 buffered w/ 8.4% NaHCO3 Hemostasis achieved with:  pressure, aluminum chloride and electrodesiccation Outcome: patient tolerated procedure well with no complications   Post-procedure details: sterile dressing applied and wound care instructions given   Dressing type: bandage and petrolatum    Epidermal inclusion cyst, ruptured and inflamed but not abscessed R mid back  Inflamed -may need I&D in  the future if becomes abscessed also discussed excision after calms down. Start Doxycycline 100mg  po BID x 10 days. Doxycycline should be taken with food to prevent nausea. Do not lay down for 30 minutes after taking. Be cautious with sun exposure and use good sun protection while on this medication. Pregnant women should not take this medication.    doxycycline (VIBRA-TABS) 100 MG tablet - R mid back    Return if symptoms worsen or fail to improve.  Luther Redo, CMA, am acting as scribe for Sarina Ser, MD .  Documentation: I have reviewed the above documentation for accuracy and completeness, and I agree with the above.  Sarina Ser, MD

## 2020-03-29 NOTE — Patient Instructions (Signed)
Doxycycline should be taken with food to prevent nausea. Do not lay down for 30 minutes after taking. Be cautious with sun exposure and use good sun protection while on this medication. Pregnant women should not take this medication.  

## 2020-04-02 ENCOUNTER — Encounter: Payer: Self-pay | Admitting: Dermatology

## 2020-04-10 ENCOUNTER — Encounter: Payer: Self-pay | Admitting: Urology

## 2020-05-03 IMAGING — DX DG HIP (WITH OR WITHOUT PELVIS) 1V PORT RIGHT
3 series · 3 of 3 positions shown · non-contrast
Comparison: None.

CLINICAL DATA: Status post total hip replacement.

EXAM:
DG HIP (WITH OR WITHOUT PELVIS) 3  V PORT RIGHT

[pelvis ap]
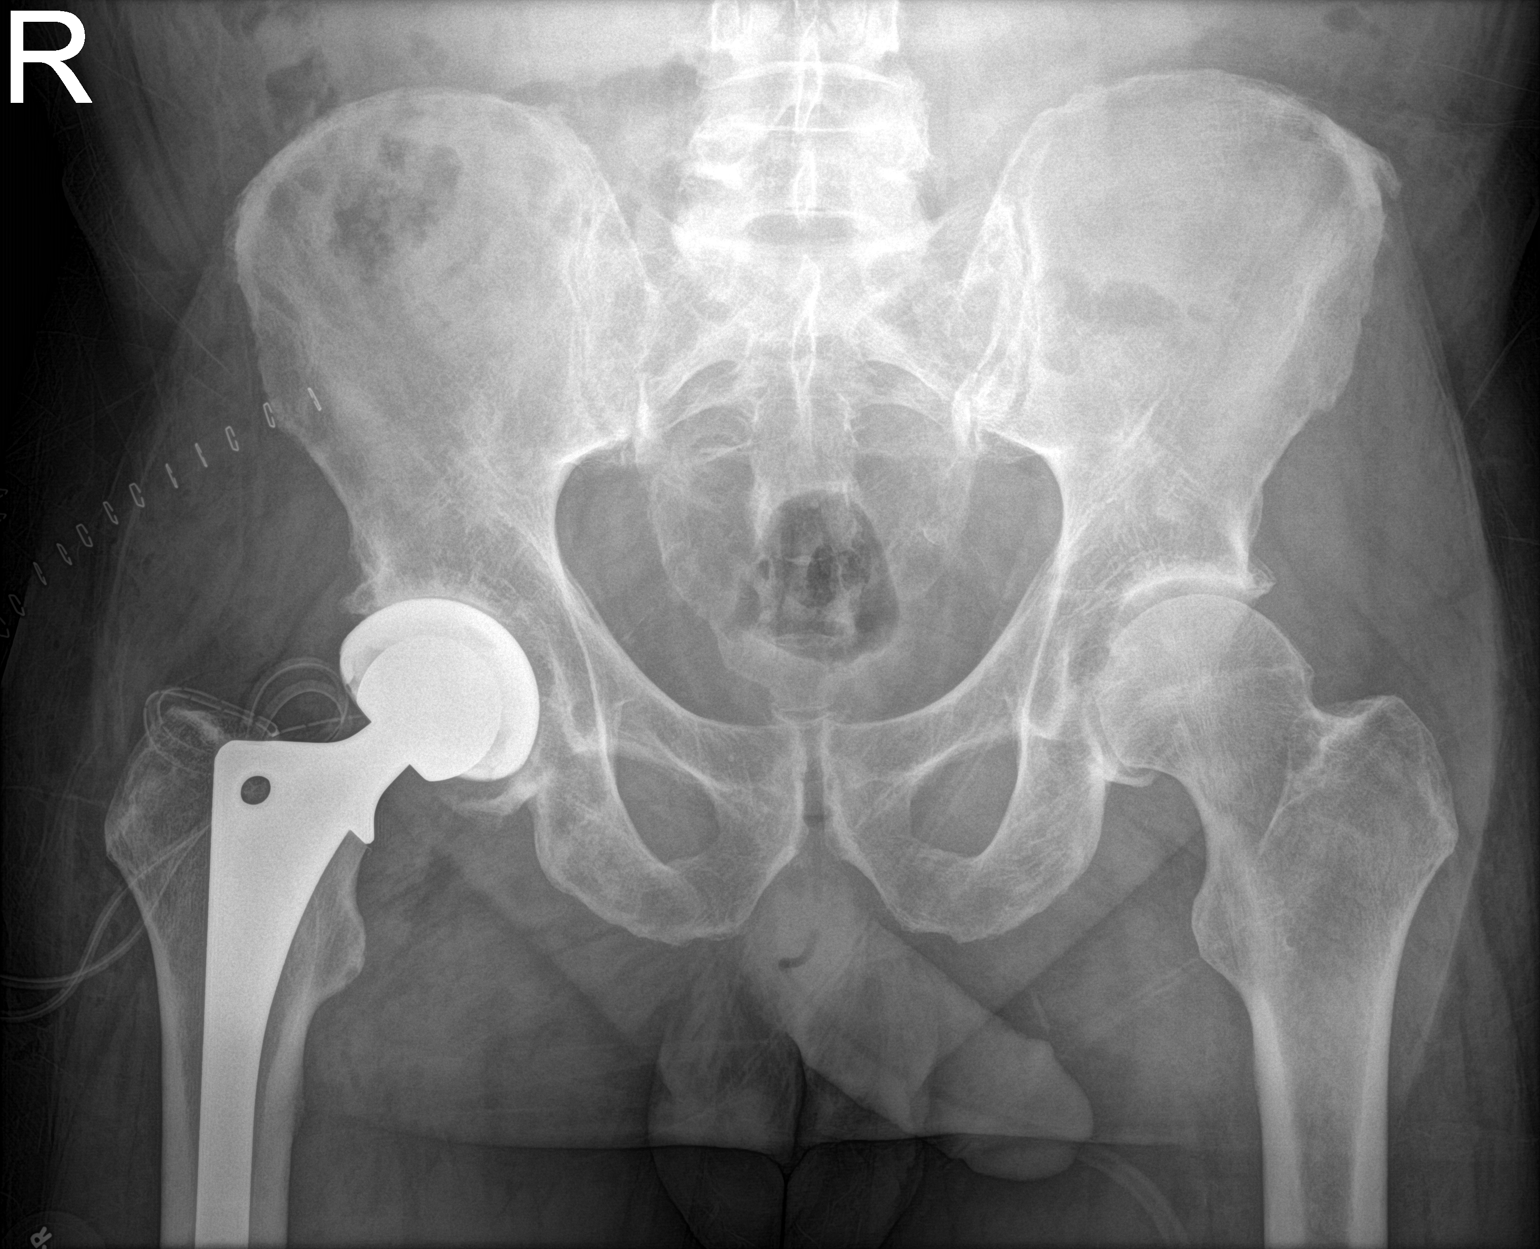

[hip ap]
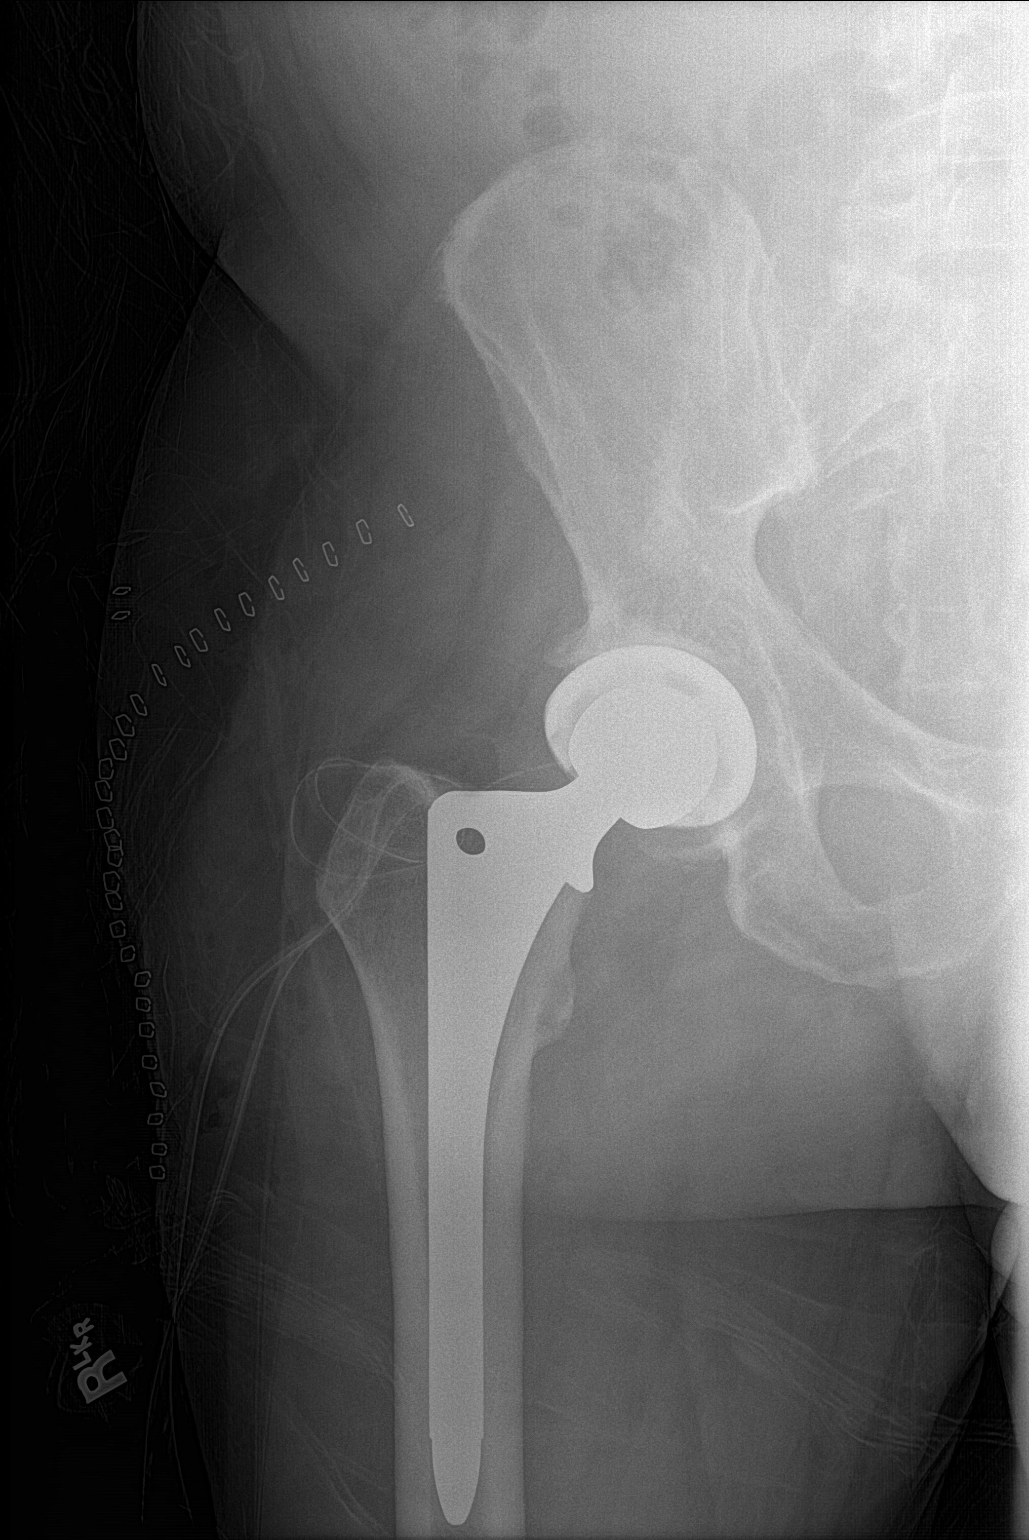

[hip lat]
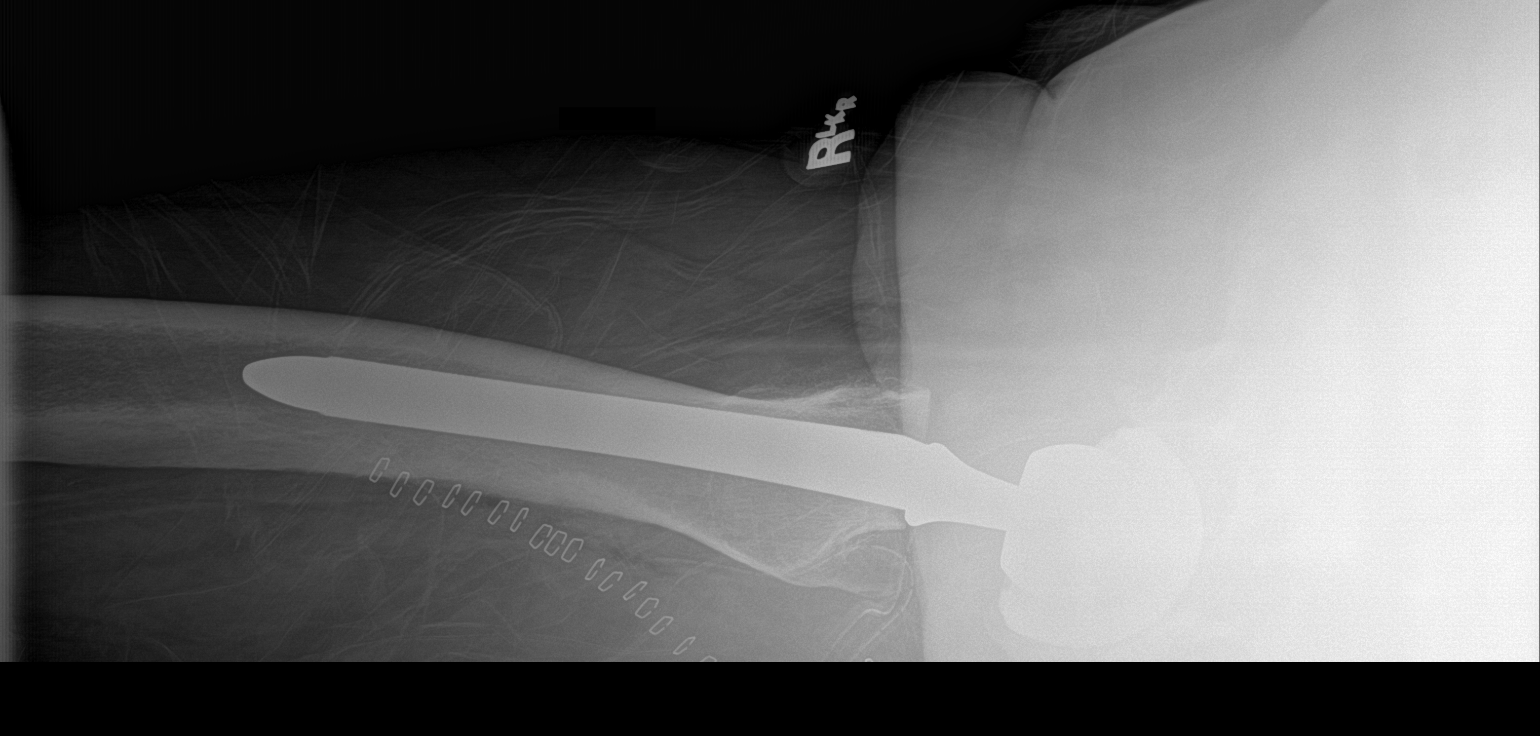

[3 of 3 positions shown; findings below may reference images not displayed]

FINDINGS: Status post RIGHT THA. Femoral and acetabular components appear
satisfactory. Surgical drain in good position.
IMPRESSION: Satisfactory position and alignment.

## 2020-05-16 ENCOUNTER — Ambulatory Visit: Payer: Medicare PPO | Attending: Internal Medicine

## 2020-05-16 DIAGNOSIS — Z23 Encounter for immunization: Secondary | ICD-10-CM

## 2020-05-16 NOTE — Progress Notes (Signed)
   Covid-19 Vaccination Clinic  Name:  PRENTICE SACKRIDER    MRN: [id number] DOB: [birth year]  05/16/2020  Mr. Kroeker was observed post Covid-19 immunization for 15 minutes without incident. He was provided with Vaccine Information Sheet and instruction to access the V-Safe system.   Mr. Kulish was instructed to call 911 with any severe reactions post vaccine: Marland Kitchen Difficulty breathing  . Swelling of face and throat  . A fast heartbeat  . A bad rash all over body  . Dizziness and weakness

## 2020-06-27 ENCOUNTER — Ambulatory Visit (INDEPENDENT_AMBULATORY_CARE_PROVIDER_SITE_OTHER): Payer: Medicare PPO | Admitting: Dermatology

## 2020-06-27 ENCOUNTER — Telehealth: Payer: Self-pay

## 2020-06-27 ENCOUNTER — Other Ambulatory Visit: Payer: Self-pay

## 2020-06-27 DIAGNOSIS — D485 Neoplasm of uncertain behavior of skin: Secondary | ICD-10-CM

## 2020-06-27 MED ORDER — MUPIROCIN 2 % EX OINT: 1.0000 "application " | TOPICAL_OINTMENT | Freq: Every day | CUTANEOUS | 0 refills | Status: DC

## 2020-06-27 NOTE — Patient Instructions (Signed)

## 2020-06-27 NOTE — Progress Notes (Signed)
   Follow-Up Visit   Subjective  Arthur Jones is a 69 y.o. male who presents for the following: Cyst (vs other of right mid back - excise today).  The following portions of the chart were reviewed this encounter and updated as appropriate:  Tobacco  Allergies  Meds  Problems  Med Hx  Surg Hx  Fam Hx     Review of Systems:  No other skin or systemic complaints except as noted in HPI or Assessment and Plan.  Objective  Well appearing patient in no apparent distress; mood and affect are within normal limits.  A focused examination was performed including back. Relevant physical exam findings are noted in the Assessment and Plan.  Objective  Right mid back: 3.7 cm cystic papule   Assessment & Plan  Neoplasm of uncertain behavior of skin Right mid back  Skin excision  Lesion length (cm):  3.7 Lesion width (cm):  3.7 Margin per side (cm):  0.2 Total excision diameter (cm):  4.1 Informed consent: discussed and consent obtained   Timeout: patient name, date of birth, surgical site, and procedure verified   Procedure prep:  Patient was prepped and draped in usual sterile fashion Prep type:  Isopropyl alcohol and povidone-iodine Anesthesia: the lesion was anesthetized in a standard fashion   Anesthetic:  1% lidocaine w/ epinephrine 1-100,000 buffered w/ 8.4% NaHCO3 Instrument used: #15 blade   Hemostasis achieved with: pressure   Hemostasis achieved with comment:  Electrocautery Outcome: patient tolerated procedure well with no complications   Post-procedure details: sterile dressing applied and wound care instructions given   Dressing type: bandage and pressure dressing (mupirocin)    Skin repair Complexity:  Complex Final length (cm):  5 Reason for type of repair: reduce tension to allow closure, reduce the risk of dehiscence, infection, and necrosis, reduce subcutaneous dead space and avoid a hematoma, allow closure of the large defect, preserve normal anatomy,  preserve normal anatomical and functional relationships and enhance both functionality and cosmetic results   Undermining: area extensively undermined   Undermining comment:  Undermining defect 4.1 cm Subcutaneous layers (deep stitches):  Suture size:  2-0 Suture type: Vicryl (polyglactin 910)   Subcutaneous suture technique: inverted dermal. Fine/surface layer approximation (top stitches):  Suture size:  2-0 Suture type: nylon   Stitches: simple running   Suture removal (days):  7 Hemostasis achieved with: suture and pressure Outcome: patient tolerated procedure well with no complications   Post-procedure details: sterile dressing applied and wound care instructions given   Dressing type: bandage and pressure dressing (mupirocin)    mupirocin ointment (BACTROBAN) 2 %  Specimen 1 - Surgical pathology Differential Diagnosis: Cyst vs other  Check Margins: No 3.7 cm cystic papule  Return in about 1 week (around 07/04/2020) for suture removal.  I, Ashok Cordia, CMA, am acting as scribe for Sarina Ser, MD .  Documentation: I have reviewed the above documentation for accuracy and completeness, and I agree with the above.  Sarina Ser, MD

## 2020-06-27 NOTE — Telephone Encounter (Signed)
Patient doing well after today's surgery./sh 

## 2020-06-28 ENCOUNTER — Encounter: Payer: Self-pay | Admitting: Dermatology

## 2020-07-04 ENCOUNTER — Other Ambulatory Visit: Payer: Self-pay

## 2020-07-04 ENCOUNTER — Ambulatory Visit (INDEPENDENT_AMBULATORY_CARE_PROVIDER_SITE_OTHER): Payer: Medicare PPO | Admitting: Dermatology

## 2020-07-04 DIAGNOSIS — L720 Epidermal cyst: Secondary | ICD-10-CM

## 2020-07-04 DIAGNOSIS — Z4802 Encounter for removal of sutures: Secondary | ICD-10-CM

## 2020-07-05 ENCOUNTER — Encounter: Payer: Self-pay | Admitting: Dermatology

## 2020-07-05 NOTE — Progress Notes (Signed)
   Follow-Up Visit   Subjective  Arthur Jones is a 69 y.o. male who presents for the following: Follow-up (Suture removal right mid back - suture removal today).  The following portions of the chart were reviewed this encounter and updated as appropriate:  Tobacco  Allergies  Meds  Problems  Med Hx  Surg Hx  Fam Hx     Review of Systems:  No other skin or systemic complaints except as noted in HPI or Assessment and Plan.  Objective  Well appearing patient in no apparent distress; mood and affect are within normal limits.  A focused examination was performed including back. Relevant physical exam findings are noted in the Assessment and Plan.  Objective  Right mid back: Healing excision site   Assessment & Plan  Epidermal inclusion cyst Right mid back  Wound cleansed, sutures removed, wound cleansed and steri strips applied. Discussed pathology results.   Return in about 1 year (around 07/04/2021).   I, Ashok Cordia, CMA, am acting as scribe for Sarina Ser, MD .  Documentation: I have reviewed the above documentation for accuracy and completeness, and I agree with the above.  Sarina Ser, MD

## 2020-07-10 ENCOUNTER — Ambulatory Visit: Payer: Medicare Other | Admitting: Urology

## 2020-07-12 ENCOUNTER — Ambulatory Visit: Payer: Medicare PPO | Admitting: Urology

## 2020-07-12 ENCOUNTER — Encounter: Payer: Self-pay | Admitting: Urology

## 2020-07-12 ENCOUNTER — Other Ambulatory Visit: Payer: Self-pay

## 2020-07-12 VITALS — BP 135/89 | HR 88 | Ht 71.0 in | Wt 224.3 lb

## 2020-07-12 DIAGNOSIS — N529 Male erectile dysfunction, unspecified: Secondary | ICD-10-CM

## 2020-07-12 DIAGNOSIS — N401 Enlarged prostate with lower urinary tract symptoms: Secondary | ICD-10-CM | POA: Diagnosis not present

## 2020-07-12 DIAGNOSIS — N138 Other obstructive and reflux uropathy: Secondary | ICD-10-CM

## 2020-07-12 MED ORDER — FINASTERIDE 5 MG PO TABS
5.0000 mg | ORAL_TABLET | Freq: Every day | ORAL | 3 refills | Status: DC
Start: 2020-07-12 — End: 2021-04-19

## 2020-07-12 NOTE — Progress Notes (Signed)
   07/12/2020 3:31 PM   TAKUMI DIN 1951-08-13 [id number]  Reason for visit: Follow up BPH, ED  HPI: I saw Mr. Bless in urology clinic for the above issues.  He is a healthy 69 year old male with a prior episode of urinary retention in 2018, as well as 1 occurrence of prostatitis.  He currently has very well controlled BPH on finasteride, and ED on sildenafil.  His primary complaint is nocturia 1-2 times per night that is minimally bothersome.  PSA previously was normal at 0.66, and we opted to discontinue screening per the AUA guidelines.  He remains very active with bike riding and hiking.  IPSS score today is 2, with quality of life mostly satisfied, and PVR is normal at 43 mL.  We briefly reviewed outlet procedure options like UroLift, and he would like to continue with medical management with finasteride at this time.  We discussed the importance of minimizing fluids before bed and double voiding prior to bedtime.  Return precautions were discussed at length.  Finasteride refilled, continue sildenafil RTC 1 year for IPSS, PVR Consider outlet procedure in the future if worsening symptoms, recurrent UTIs, or retention  Billey Co, MD  [place] [address], [place], [place] [address] [phone]

## 2020-07-12 NOTE — Patient Instructions (Signed)

## 2020-12-26 ENCOUNTER — Other Ambulatory Visit: Payer: Self-pay | Admitting: *Deleted

## 2020-12-26 DIAGNOSIS — N200 Calculus of kidney: Secondary | ICD-10-CM

## 2020-12-27 NOTE — Progress Notes (Signed)
12/28/2020 11:59 AM   Arthur Jones 08-Aug-1951 [id number]  Referring provider: Derinda Late, MD [phone] [address] [facility] [place],  [place] [address]  Chief Complaint  Patient presents with  . Nephrolithiasis   Urological history: 1. Urinary retention -episode of retention 2018  2. BPH with LU TS -I PSS 6/2 -PVR 115 mL -cysto 2019 Moderate prostate enlargement with inflammatory change otherwise negative cystoscopy -managed with tamsulosin 0.4 mg daily and finasteride 5 mg daily   3. ED -contributing factors of age, BPH, HTN and HLD -SHIM 9 -managed with sildenafil 100 mg, on-demand-dosing  HPI: Arthur Jones is a 70 y.o. male who presents today for one year follow up.  He had an episode of gross hematuria that lasted all last weekend with out discomfort.  He then had the sudden onset of right sided flank pain over using his exercise bike.  Patient denies any modifying or aggravating factors.  Patient denies any dysuria.  Patient denies any fevers, chills, nausea or vomiting.   UA > 30 RBC's   KUB 6 mm calcification at above L3 on the right.     IPSS    Row Name 12/28/20 1100         International Prostate Symptom Score   How often have you had the sensation of not emptying your bladder? Less than 1 in 5     How often have you had to urinate less than every two hours? Less than half the time     How often have you found you stopped and started again several times when you urinated? Less than 1 in 5 times     How often have you found it difficult to postpone urination? Not at All     How often have you had a weak urinary stream? Not at All     How often have you had to strain to start urination? Not at All     How many times did you typically get up at night to urinate? 2 Times     Total IPSS Score 6           Quality of Life due to urinary symptoms   If you were to spend the rest of your life with your urinary  condition just the way it is now how would you feel about that? Mostly Satisfied            Score:  1-7 Mild 8-19 Moderate 20-35 Severe    Patient still having spontaneous erections.  He denies any pain or curvature with erections.    SHIM    Row Name 12/28/20 1129         SHIM: Over the last 6 months:   How do you rate your confidence that you could get and keep an erection? Moderate     When you had erections with sexual stimulation, how often were your erections hard enough for penetration (entering your partner)? A Few Times (much less than half the time)     During sexual intercourse, how often were you able to maintain your erection after you had penetrated (entered) your partner? Almost Never or Never     During sexual intercourse, how difficult was it to maintain your erection to completion of intercourse? Extremely Difficult     When you attempted sexual intercourse, how often was it satisfactory for you? A Few Times (much less than half the time)  SHIM Total Score   SHIM 9            Score: 1-7 Severe ED 8-11 Moderate ED 12-16 Mild-Moderate ED 17-21 Mild ED 22-25 No ED  PMH: Past Medical History:  Diagnosis Date  . Erectile dysfunction   . High cholesterol   . Hypertension   . Urinary retention     Surgical History: Past Surgical History:  Procedure Laterality Date  . COLONOSCOPY    . NO PAST SURGERIES    . TONSILLECTOMY     as a child  . TOTAL HIP ARTHROPLASTY Right 01/18/2019   Procedure: TOTAL HIP ARTHROPLASTY - RIGHT;  Surgeon: Dereck Leep, MD;  Location: [facility] ORS;  Service: Orthopedics;  Laterality: Right;    Home Medications:  Allergies as of 12/28/2020   No Known Allergies     Medication List       Accurate as of December 28, 2020 11:59 AM. If you have any questions, ask your nurse or doctor.        STOP taking these medications   mupirocin ointment 2 % Commonly known as: BACTROBAN Stopped by: Zara Council,  PA-C     TAKE these medications   amLODipine 10 MG tablet Commonly known as: NORVASC Take 10 mg by mouth at bedtime.   amoxicillin 500 MG capsule Commonly known as: AMOXIL   finasteride 5 MG tablet Commonly known as: PROSCAR Take 1 tablet (5 mg total) by mouth daily.   fluticasone 50 MCG/ACT nasal spray Commonly known as: FLONASE Place 1 spray into the nose daily as needed for allergies.   sildenafil 50 MG tablet Commonly known as: VIAGRA Take 50 mg as needed by mouth for erectile dysfunction. What changed: Another medication with the same name was added. Make sure you understand how and when to take each. Changed by: Zara Council, PA-C   sildenafil 100 MG tablet Commonly known as: VIAGRA Take 1 tablet (100 mg total) by mouth daily as needed for erectile dysfunction. Take two hours prior to intercourse on an empty stomach What changed: You were already taking a medication with the same name, and this prescription was added. Make sure you understand how and when to take each. Changed by: Zara Council, PA-C   simvastatin 40 MG tablet Commonly known as: ZOCOR Take 40 mg by mouth every evening.   telmisartan-hydrochlorothiazide 80-12.5 MG tablet Commonly known as: MICARDIS HCT       Allergies: No Known Allergies  Family History: Family History  Problem Relation Age of Onset  . Hypertension Father   . Hypercholesterolemia Father   . Hypertension Mother   . Hypercholesterolemia Mother     Social History:  reports that he has never smoked. He has never used smokeless tobacco. He reports current alcohol use. He reports that he does not use drugs.  ROS: Pertinent ROS in HPI  Physical Exam: BP (!) 156/76   Pulse 87   Ht _0  (1.803 m)   Wt 220 lb (99.8 kg)   BMI 30.68 kg/m   Constitutional:  Well nourished. Alert and oriented, No acute distress. HEENT: [place] AT, mask in place.  Trachea midline Cardiovascular: No clubbing, cyanosis, or edema. Respiratory:  Normal respiratory effort, no increased work of breathing. Neurologic: Grossly intact, no focal deficits, moving all 4 extremities. Psychiatric: Normal mood and affect.  Laboratory Data: Urinalysis Component     Latest Ref Rng & Units 12/28/2020  Specific Gravity, UA     1.005 - 1.030 1.015  pH,  UA     5.0 - 7.5 7.0  Color, UA     Yellow Orange  Appearance Ur     Clear Cloudy (A)  Leukocytes,UA     Negative Trace (A)  Protein,UA     Negative/Trace Negative  Glucose, UA     Negative Negative  Ketones, UA     Negative Trace (A)  RBC, UA     Negative 2+ (A)  Bilirubin, UA     Negative Negative  Urobilinogen, Ur     0.2 - 1.0 mg/dL 0.2  Nitrite, UA     Negative Negative  Microscopic Examination      See below:   Component     Latest Ref Rng & Units 12/28/2020  WBC, UA     0 - 5 /hpf 0-5  RBC     0 - 2 /hpf >30 (A)  Epithelial Cells (non renal)     0 - 10 /hpf 0-10  Casts     None seen /lpf Present (A)  Cast Type     N/A Hyaline casts  Bacteria, UA     None seen/Few None seen   WBC (White Blood Cell Count) 4.1 - 10.2 10^3/uL 7.2   RBC (Red Blood Cell Count) 4.69 - 6.13 10^6/uL 5.35   Hemoglobin 14.1 - 18.1 gm/dL 15.7   Hematocrit 40.0 - 52.0 % 47.1   MCV (Mean Corpuscular Volume) 80.0 - 100.0 fl 88.0   MCH (Mean Corpuscular Hemoglobin) 27.0 - 31.2 pg 29.3   MCHC (Mean Corpuscular Hemoglobin Concentration) 32.0 - 36.0 gm/dL 33.3   Platelet Count 150 - 450 10^3/uL 220   RDW-CV (Red Cell Distribution Width) 11.6 - 14.8 % 13.2   MPV (Mean Platelet Volume) 9.4 - 12.4 fl 9.3Low   Neutrophils 1.50 - 7.80 10^3/uL 4.30   Lymphocytes 1.00 - 3.60 10^3/uL 2.20   Mixed Count 0.10 - 0.90 10^3/uL 0.70   Neutrophil % 32.0 - 70.0 % 59.7   Lymphocyte % 10.0 - 50.0 % 30.5   Mixed % 3.0 - 14.4 % 9.8   Resulting Agency  [facility] - LAB  Specimen Collected: 08/22/20 08:27 Last Resulted: 08/22/20 09:25  Received From: [facility]  Result Received:  12/26/20 11:25   Glucose 70 - 110 mg/dL 96   Sodium 136 - 145 mmol/L 139   Potassium 3.6 - 5.1 mmol/L 4.1   Chloride 97 - 109 mmol/L 100   Carbon Dioxide (CO2) 22.0 - 32.0 mmol/L 29.6   Urea Nitrogen (BUN) 7 - 25 mg/dL 14   Creatinine 0.7 - 1.3 mg/dL 0.8   Glomerular Filtration Rate (eGFR), MDRD Estimate >60 mL/min/1.73sq m 95   Calcium 8.7 - 10.3 mg/dL 9.8   AST  8 - 39 U/L 27   ALT  6 - 57 U/L 37   Alk Phos (alkaline Phosphatase) 34 - 104 U/L 53   Albumin 3.5 - 4.8 g/dL 4.6   Bilirubin, Total 0.3 - 1.2 mg/dL 1.9High   Protein, Total 6.1 - 7.9 g/dL 6.9   A/G Ratio 1.0 - 5.0 gm/dL 2.0   Resulting Agency  [facility] - [facility]  Specimen Collected: 08/22/20 08:27 Last Resulted: 08/22/20 12:16  Received From: [facility]  Result Received: 12/26/20 11:25   Hemoglobin A1C 4.2 - 5.6 % 5.4   Average Blood Glucose (Calc) mg/dL 108   Resulting Agency  [facility] - LAB   Narrative  Normal Range:  4.2 - 5.6%  Increased Risk: 5.7 - 6.4%  Diabetes:    >= 6.5%  Glycemic Control for adults with diabetes: <7%  Specimen Collected: 08/22/20 08:27 Last Resulted: 08/22/20 13:34  Received From: [facility]  Result Received: 12/26/20 11:25   Cholesterol, Total 100 - 200 mg/dL 235High   Triglyceride 35 - 199 mg/dL 98   HDL (High Density Lipoprotein) Cholesterol 29.0 - 71.0 mg/dL 97.9High   LDL Calculated 0 - 130 mg/dL 118   VLDL Cholesterol mg/dL 20   Cholesterol/HDL Ratio  2.4   Resulting Agency  [facility] - LAB  Specimen Collected: 08/22/20 08:27 Last Resulted: 08/22/20 12:16  Received From: [facility]  Result Received: 12/26/20 11:25   PSA (Prostate Specific Antigen), Total 0.10 - 4.00 ng/mL 0.53   Resulting Agency  [facility] - LAB   Narrative Performed by [facility] - [facility] Test results were determined with Beckman Coulter Hybritech Assay. Values obtained with different  assay methods cannot be used interchangeably in serial testing. Assay results should not be interpreted as absolute evidence of the presence or absence of malignant disease Specimen Collected: 02/21/20 08:32 Last Resulted: 02/21/20 13:08  Received From: [place] [facility]  Result Received: 12/26/20 11:25  I have reviewed the labs.   Pertinent Imaging: CLINICAL DATA:  Right flank pain, hematuria  EXAM: ABDOMEN - 1 VIEW  COMPARISON:  None.  FINDINGS: The bowel gas pattern is normal. Nonspecific 5 mm calcification projects above the right L3 transverse process. No calcification is evident projecting over either renal shadow. Partially visualized right hip arthroplasty.  IMPRESSION: Nonspecific 5 mm calcification projects above the right L3 transverse process. This could potentially represent a proximal right ureteral calculus. Consider ultrasound to further evaluate for obstructive uropathy.   Electronically Signed   By: Davina Poke D.O.   On: 12/29/2020 14:31 I have independently reviewed the films.  See HPI.      Assessment & Plan:     1. Gross hematuria - I explained to the patient that there are a number of causes that can be associated with blood in the urine, such as stones, BPH, UTI's, damage to the urinary tract and/or cancer. -At this time, I felt that the patient warranted further urologic evaluation with CT urogram and cystoscopy  - Following the imaging study,  I've recommended a cystoscopy. I described how this is performed, typically in an office setting with a flexible cystoscope. We described the risks, benefits, and possible side effects, the most common of which is a minor amount of blood in the urine and/or burning which usually resolves in 24 to 48 hours.   - The patient had the opportunity to ask questions which were answered. Based upon this discussion, the patient is willing to proceed. Therefore, I've ordered: a CT Urogram and  cystoscopy. -The patient will return following all of the above for discussion of the results.  - UA - Urine culture   2. BPH with LU TS -symptoms are stable -continue finasteride 5 mg daily  3. ED -continue sildenafil 100 mg, on-demand-dosing  Return for CT Urogram report and cystoscopy with Dr. Diamantina Providence .  These notes generated with voice recognition software. I apologize for typographical errors.  Royden Purl  [facility] [address]  [place], [place] [address] [phone]   Addendum: During patient CT urogram today on Jan 04, 2021, it was discovered that the calcification in the right mid ureter was indeed  a stone and therefore the CT urogram was converted to a CT renal stone study.  He will present tomorrow for further discussion on definitive treatment.  Urine culture preliminary status it is growing colonies, so I have started him on Septra DS empirically.

## 2020-12-28 ENCOUNTER — Encounter: Payer: Self-pay | Admitting: Urology

## 2020-12-28 ENCOUNTER — Ambulatory Visit
Admission: RE | Admit: 2020-12-28 | Discharge: 2020-12-28 | Disposition: A | Discharge status: Home / Self Care | Payer: Medicare PPO | Attending: Urology | Admitting: Urology

## 2020-12-28 ENCOUNTER — Ambulatory Visit
Admission: RE | Admit: 2020-12-28 | Discharge: 2020-12-28 | Disposition: A | Discharge status: Home / Self Care | Payer: Medicare PPO | Source: Ambulatory Visit | Attending: Urology | Admitting: Urology

## 2020-12-28 ENCOUNTER — Ambulatory Visit: Payer: Medicare PPO | Admitting: Urology

## 2020-12-28 ENCOUNTER — Other Ambulatory Visit: Payer: Self-pay

## 2020-12-28 VITALS — BP 156/76 | HR 87 | Ht 71.0 in | Wt 220.0 lb

## 2020-12-28 DIAGNOSIS — N200 Calculus of kidney: Secondary | ICD-10-CM | POA: Diagnosis not present

## 2020-12-28 DIAGNOSIS — R310 Gross hematuria: Secondary | ICD-10-CM | POA: Diagnosis not present

## 2020-12-28 DIAGNOSIS — R109 Unspecified abdominal pain: Secondary | ICD-10-CM | POA: Diagnosis not present

## 2020-12-28 LAB — BLADDER SCAN AMB NON-IMAGING

## 2020-12-28 MED ORDER — SILDENAFIL CITRATE 100 MG PO TABS: 100.0000 mg | ORAL_TABLET | Freq: Every day | ORAL | 12 refills | Status: DC | PRN

## 2020-12-29 LAB — URINALYSIS, COMPLETE
Bilirubin, UA: NEGATIVE
Glucose, UA: NEGATIVE
Nitrite, UA: NEGATIVE
Protein,UA: NEGATIVE
Specific Gravity, UA: 1.015 (ref 1.005–1.030)
Urobilinogen, Ur: 0.2 mg/dL (ref 0.2–1.0)
pH, UA: 7 (ref 5.0–7.5)

## 2020-12-29 LAB — MICROSCOPIC EXAMINATION
Bacteria, UA: NONE SEEN
RBC, Urine: >30 /hpf — AB (ref 0–2)

## 2021-01-01 ENCOUNTER — Other Ambulatory Visit: Payer: Self-pay | Admitting: Urology

## 2021-01-01 ENCOUNTER — Telehealth: Payer: Self-pay

## 2021-01-01 MED ORDER — HYDROCODONE-ACETAMINOPHEN 5-325 MG PO TABS: 1.0000 | ORAL_TABLET | ORAL | 0 refills | Status: DC | PRN

## 2021-01-01 NOTE — Telephone Encounter (Signed)
Pt LVM on triage line stating that Advil is not helping any with his pain. Pain is located in Lower Right Abdominal area. Please advise.

## 2021-01-01 NOTE — Progress Notes (Signed)
Please let Mr. Tallarico know that I have sent in a prescription for Vicodin 5/325, # 10, q 4 hours for pain.  Radiologist may have seen a stone on his Xray, but we will know more after CT scan is completed.

## 2021-01-02 ENCOUNTER — Other Ambulatory Visit: Payer: Self-pay | Admitting: Urology

## 2021-01-02 ENCOUNTER — Ambulatory Visit
Admission: RE | Admit: 2021-01-02 | Discharge: 2021-01-02 | Disposition: A | Discharge status: Home / Self Care | Payer: Medicare PPO | Source: Ambulatory Visit | Attending: Urology | Admitting: Urology

## 2021-01-02 ENCOUNTER — Other Ambulatory Visit: Payer: Self-pay

## 2021-01-02 DIAGNOSIS — R310 Gross hematuria: Secondary | ICD-10-CM

## 2021-01-02 DIAGNOSIS — R109 Unspecified abdominal pain: Secondary | ICD-10-CM | POA: Insufficient documentation

## 2021-01-02 MED ORDER — SULFAMETHOXAZOLE-TRIMETHOPRIM 800-160 MG PO TABS: 1.0000 | ORAL_TABLET | Freq: Two times a day (BID) | ORAL | 0 refills | Status: DC

## 2021-01-02 NOTE — H&P (View-Only) (Signed)
01/03/2021 5:02 PM   Arthur Jones May 27, 1951 [id number]  Referring provider: Derinda Late, MD [phone] [address] [facility] [place],  [place] [address]  Chief Complaint  Patient presents with  . Follow-up   Urological history: 1. Urinary retention -episode of retention 2018  2. BPH with LU TS -I PSS 6/2 -PVR 115 mL -cysto 2019 Moderate prostate enlargement with inflammatory change otherwise negative cystoscopy -managed with tamsulosin 0.4 mg daily and finasteride 5 mg daily   3. ED -contributing factors of age, BPH, HTN and HLD -SHIM 9 -managed with sildenafil 100 mg, on-demand-dosing  HPI: Arthur Jones is a 70 y.o. male who presents today for right proximal ureteral stone.  He states that he has been managing his pain with ibuprofen at home.  Patient denies any modifying or aggravating factors.  Patient denies any gross hematuria, dysuria or suprapubic/flank pain.  Patient denies any fevers, chills, nausea or vomiting.   CT renal stone study 8 mm proximal ureteral stone with hydro.   Urine culture pending.   PMH: Past Medical History:  Diagnosis Date  . Erectile dysfunction   . High cholesterol   . Hypertension   . Urinary retention     Surgical History: Past Surgical History:  Procedure Laterality Date  . COLONOSCOPY    . NO PAST SURGERIES    . TONSILLECTOMY     as a child  . TOTAL HIP ARTHROPLASTY Right 01/18/2019   Procedure: TOTAL HIP ARTHROPLASTY - RIGHT;  Surgeon: Dereck Leep, MD;  Location: [facility] ORS;  Service: Orthopedics;  Laterality: Right;    Home Medications:  Allergies as of 01/03/2021   No Known Allergies     Medication List       Accurate as of Jan 03, 2021 11:59 PM. If you have any questions, ask your nurse or doctor.        STOP taking these medications   amoxicillin 500 MG capsule Commonly known as: AMOXIL Stopped by: Brinlyn Cena, PA-C     TAKE these medications    amLODipine 10 MG tablet Commonly known as: NORVASC Take 10 mg by mouth at bedtime.   finasteride 5 MG tablet Commonly known as: PROSCAR Take 1 tablet (5 mg total) by mouth daily.   fluticasone 50 MCG/ACT nasal spray Commonly known as: FLONASE Place 1 spray into the nose daily as needed for allergies.   HYDROcodone-acetaminophen 5-325 MG tablet Commonly known as: NORCO/VICODIN Take 1 tablet by mouth every 4 (four) hours as needed for moderate pain.   sildenafil 100 MG tablet Commonly known as: VIAGRA Take 1 tablet (100 mg total) by mouth daily as needed for erectile dysfunction. Take two hours prior to intercourse on an empty stomach What changed: Another medication with the same name was removed. Continue taking this medication, and follow the directions you see here. Changed by: Zara Council, PA-C   simvastatin 40 MG tablet Commonly known as: ZOCOR Take 40 mg by mouth every evening.   sulfamethoxazole-trimethoprim 800-160 MG tablet Commonly known as: BACTRIM DS Take 1 tablet by mouth every 12 (twelve) hours.   tamsulosin 0.4 MG Caps capsule Commonly known as: FLOMAX Take 1 capsule (0.4 mg total) by mouth daily. Started by: Zara Council, PA-C   telmisartan-hydrochlorothiazide 80-12.5 MG tablet Commonly known as: MICARDIS HCT       Allergies: No Known Allergies  Family History: Family History  Problem Relation Age of Onset  . Hypertension Father   .  Hypercholesterolemia Father   . Hypertension Mother   . Hypercholesterolemia Mother     Social History:  reports that he has never smoked. He has never used smokeless tobacco. He reports current alcohol use. He reports that he does not use drugs.  ROS: Pertinent ROS in HPI  Physical Exam: BP 138/84   Pulse 76   Ht 5' 11"  (1.803 m)   Wt 220 lb (99.8 kg)   BMI 30.68 kg/m   Constitutional:  Well nourished. Alert and oriented, No acute distress. HEENT: [place] AT, mask in place.  Trachea  midline Cardiovascular: No clubbing, cyanosis, or edema. Respiratory: Normal respiratory effort, no increased work of breathing. Neurologic: Grossly intact, no focal deficits, moving all 4 extremities. Psychiatric: Normal mood and affect.  Laboratory Data: Urinalysis Component     Latest Ref Rng & Units 12/28/2020  Specific Gravity, UA     1.005 - 1.030 1.015  pH, UA     5.0 - 7.5 7.0  Color, UA     Yellow Orange  Appearance Ur     Clear Cloudy (A)  Leukocytes,UA     Negative Trace (A)  Protein,UA     Negative/Trace Negative  Glucose, UA     Negative Negative  Ketones, UA     Negative Trace (A)  RBC, UA     Negative 2+ (A)  Bilirubin, UA     Negative Negative  Urobilinogen, Ur     0.2 - 1.0 mg/dL 0.2  Nitrite, UA     Negative Negative  Microscopic Examination      See below:   Component     Latest Ref Rng & Units 12/28/2020  WBC, UA     0 - 5 /hpf 0-5  RBC     0 - 2 /hpf >30 (A)  Epithelial Cells (non renal)     0 - 10 /hpf 0-10  Casts     None seen /lpf Present (A)  Cast Type     N/A Hyaline casts  Bacteria, UA     None seen/Few None seen   WBC (White Blood Cell Count) 4.1 - 10.2 10^3/uL 7.2   RBC (Red Blood Cell Count) 4.69 - 6.13 10^6/uL 5.35   Hemoglobin 14.1 - 18.1 gm/dL 15.7   Hematocrit 40.0 - 52.0 % 47.1   MCV (Mean Corpuscular Volume) 80.0 - 100.0 fl 88.0   MCH (Mean Corpuscular Hemoglobin) 27.0 - 31.2 pg 29.3   MCHC (Mean Corpuscular Hemoglobin Concentration) 32.0 - 36.0 gm/dL 33.3   Platelet Count 150 - 450 10^3/uL 220   RDW-CV (Red Cell Distribution Width) 11.6 - 14.8 % 13.2   MPV (Mean Platelet Volume) 9.4 - 12.4 fl 9.3Low   Neutrophils 1.50 - 7.80 10^3/uL 4.30   Lymphocytes 1.00 - 3.60 10^3/uL 2.20   Mixed Count 0.10 - 0.90 10^3/uL 0.70   Neutrophil % 32.0 - 70.0 % 59.7   Lymphocyte % 10.0 - 50.0 % 30.5   Mixed % 3.0 - 14.4 % 9.8   Resulting Agency  [facility] - LAB  Specimen Collected: 08/22/20 08:27 Last Resulted: 08/22/20  09:25  Received From: [facility]  Result Received: 12/26/20 11:25   Glucose 70 - 110 mg/dL 96   Sodium 136 - 145 mmol/L 139   Potassium 3.6 - 5.1 mmol/L 4.1   Chloride 97 - 109 mmol/L 100   Carbon Dioxide (CO2) 22.0 - 32.0 mmol/L 29.6   Urea Nitrogen (BUN) 7 - 25 mg/dL 14  Creatinine 0.7 - 1.3 mg/dL 0.8   Glomerular Filtration Rate (eGFR), MDRD Estimate >60 mL/min/1.73sq m 95   Calcium 8.7 - 10.3 mg/dL 9.8   AST  8 - 39 U/L 27   ALT  6 - 57 U/L 37   Alk Phos (alkaline Phosphatase) 34 - 104 U/L 53   Albumin 3.5 - 4.8 g/dL 4.6   Bilirubin, Total 0.3 - 1.2 mg/dL 1.9High   Protein, Total 6.1 - 7.9 g/dL 6.9   A/G Ratio 1.0 - 5.0 gm/dL 2.0   Resulting [facility] - LAB  Specimen Collected: 08/22/20 08:27 Last Resulted: 08/22/20 12:16  Received From: [facility]  Result Received: 12/26/20 11:25   Hemoglobin A1C 4.2 - 5.6 % 5.4   Average Blood Glucose (Calc) mg/dL 108   Resulting Agency  [facility] - LAB   Narrative  Normal Range:  4.2 - 5.6%  Increased Risk: 5.7 - 6.4%  Diabetes:    >= 6.5%  Glycemic Control for adults with diabetes: <7%  Specimen Collected: 08/22/20 08:27 Last Resulted: 08/22/20 13:34  Received From: [facility]  Result Received: 12/26/20 11:25   Cholesterol, Total 100 - 200 mg/dL 235High   Triglyceride 35 - 199 mg/dL 98   HDL (High Density Lipoprotein) Cholesterol 29.0 - 71.0 mg/dL 97.9High   LDL Calculated 0 - 130 mg/dL 118   VLDL Cholesterol mg/dL 20   Cholesterol/HDL Ratio  2.4   Resulting Agency  [facility] - LAB  Specimen Collected: 08/22/20 08:27 Last Resulted: 08/22/20 12:16  Received From: [facility]  Result Received: 12/26/20 11:25   PSA (Prostate Specific Antigen), Total 0.10 - 4.00 ng/mL 0.53   Resulting Agency  [facility] - LAB   Narrative Performed by [facility] - LAB Test results were determined  with Beckman Coulter Hybritech Assay. Values obtained with different assay methods cannot be used interchangeably in serial testing. Assay results should not be interpreted as absolute evidence of the presence or absence of malignant disease Specimen Collected: 02/21/20 08:32 Last Resulted: 02/21/20 13:08  Received From: [facility]  Result Received: 12/26/20 11:25    Pertinent Imaging: CLINICAL DATA:  Right flank pain, gross hematuria, history of nephrolithiasis  EXAM: CT ABDOMEN AND PELVIS WITHOUT CONTRAST  TECHNIQUE: Multidetector CT imaging of the abdomen and pelvis was performed following the standard protocol without IV contrast.  COMPARISON:  None.  FINDINGS: Lower chest: Coronary calcifications. No pleural or pericardial effusion.  Hepatobiliary: Several subcentimeter partially calcified stones in the dependent aspect of the nondilated gallbladder. No focal liver lesion or biliary ductal dilatation.  Pancreas: Unremarkable. No pancreatic ductal dilatation or surrounding inflammatory changes.  Spleen: Normal in size without focal abnormality.  Adrenals/Urinary Tract: Adrenal glands unremarkable.  0.8 cm proximal right ureteral calculus with moderate right hydronephrosis. 2.7 cm probable cyst, mid right kidney. 2.1 cm probable cyst, superior left kidney. 4.6 cm probable cyst, mid left kidney. Urinary bladder incompletely distended.  Stomach/Bowel: Stomach is nondistended. Small bowel decompressed. Appendix not identified. No pericecal inflammatory/edematous change. The colon is nondilated, unremarkable.  Vascular/Lymphatic: Mild scattered aortic and iliofemoral atheromatous plaque without aneurysm. No abdominal or pelvic adenopathy.  Reproductive: Mild prostate enlargement with central coarse calcifications.  Other: No ascites.  No free air.  Musculoskeletal: Mild right retroperitoneal inflammatory/edematous changes along the  ureter. Right hip arthroplasty hardware partially visualized. No acute fracture or worrisome bone lesion.  IMPRESSION: 1. 0.8 cm obstructing proximal right  ureteral calculus with moderate hydronephrosis. 2. cholelithiasis 3. Aortoiliac and coronary  atherosclerosis (ICD10-I70.0).   Electronically Signed   By: Lucrezia Europe M.D.   On: 01/02/2021 15:16 I have independently reviewed the films.  Skin to stone distance 16 cm.  Stone density <1500 HU See HPI.      Assessment & Plan:    1. Right ureteral stone -Explained to the patient that since their stone is  ?10 mm, it is in within AUA Guidelines to continue MET with tamsulosin and pushing fluids for 4 to 6 weeks, but the success rate was below 20%  -We discussed various treatment options for urolithiasis including observation with or without medical expulsive therapy, shockwave lithotripsy (SWL), ureteroscopy and laser lithotripsy with stent placement.  -We discussed that management is based on stone size, location, density, patient co-morbidities, and patient preference.   -ESWL has a lower stone free rate in a single procedure, but also a lower complication rate compared to ureteroscopy and avoids a stent and associated stent related symptoms. Possible complications include renal hematoma, steinstrasse, and need for additional treatment. -Ureteroscopy with laser lithotripsy and stent placement has a higher stone free rate than SWL in a single procedure, however increased complication rate including possible infection, ureteral injury, bleeding, and stent related morbidity. Common stent related symptoms include dysuria, urgency/frequency, and flank pain -He was hesitant about undergoing a procedure under general in the OR and having a stent, so he would like to try right ESWL first for his stone -script given for tamuslosin 0.4 mg daily    Return for right ESWL .  These notes generated with voice recognition software. I apologize for  typographical errors.  Zara Council, PA-C  [facility] [address]  [place], [place] [address] [phone]

## 2021-01-02 NOTE — Progress Notes (Signed)
01/03/2021 5:02 PM   Arthur Jones 02-02-1951 [id number]  Referring provider: Derinda Late, MD [phone] [address] [facility] [place],  [place] [address]  Chief Complaint  Patient presents with  . Follow-up   Urological history: 1. Urinary retention -episode of retention 2018  2. BPH with LU TS -I PSS 6/2 -PVR 115 mL -cysto 2019 Moderate prostate enlargement with inflammatory change otherwise negative cystoscopy -managed with tamsulosin 0.4 mg daily and finasteride 5 mg daily   3. ED -contributing factors of age, BPH, HTN and HLD -SHIM 9 -managed with sildenafil 100 mg, on-demand-dosing  HPI: Arthur Jones is a 70 y.o. male who presents today for right proximal ureteral stone.  He states that he has been managing his pain with ibuprofen at home.  Patient denies any modifying or aggravating factors.  Patient denies any gross hematuria, dysuria or suprapubic/flank pain.  Patient denies any fevers, chills, nausea or vomiting.   CT renal stone study 8 mm proximal ureteral stone with hydro.   Urine culture pending.   PMH: Past Medical History:  Diagnosis Date  . Erectile dysfunction   . High cholesterol   . Hypertension   . Urinary retention     Surgical History: Past Surgical History:  Procedure Laterality Date  . COLONOSCOPY    . NO PAST SURGERIES    . TONSILLECTOMY     as a child  . TOTAL HIP ARTHROPLASTY Right 01/18/2019   Procedure: TOTAL HIP ARTHROPLASTY - RIGHT;  Surgeon: Dereck Leep, MD;  Location: [facility] ORS;  Service: Orthopedics;  Laterality: Right;    Home Medications:  Allergies as of 01/03/2021   No Known Allergies     Medication List       Accurate as of Jan 03, 2021 11:59 PM. If you have any questions, ask your nurse or doctor.        STOP taking these medications   amoxicillin 500 MG capsule Commonly known as: AMOXIL Stopped by: Marcelle Bebout, PA-C     TAKE these medications    amLODipine 10 MG tablet Commonly known as: NORVASC Take 10 mg by mouth at bedtime.   finasteride 5 MG tablet Commonly known as: PROSCAR Take 1 tablet (5 mg total) by mouth daily.   fluticasone 50 MCG/ACT nasal spray Commonly known as: FLONASE Place 1 spray into the nose daily as needed for allergies.   HYDROcodone-acetaminophen 5-325 MG tablet Commonly known as: NORCO/VICODIN Take 1 tablet by mouth every 4 (four) hours as needed for moderate pain.   sildenafil 100 MG tablet Commonly known as: VIAGRA Take 1 tablet (100 mg total) by mouth daily as needed for erectile dysfunction. Take two hours prior to intercourse on an empty stomach What changed: Another medication with the same name was removed. Continue taking this medication, and follow the directions you see here. Changed by: Zara Council, PA-C   simvastatin 40 MG tablet Commonly known as: ZOCOR Take 40 mg by mouth every evening.   sulfamethoxazole-trimethoprim 800-160 MG tablet Commonly known as: BACTRIM DS Take 1 tablet by mouth every 12 (twelve) hours.   tamsulosin 0.4 MG Caps capsule Commonly known as: FLOMAX Take 1 capsule (0.4 mg total) by mouth daily. Started by: Zara Council, PA-C   telmisartan-hydrochlorothiazide 80-12.5 MG tablet Commonly known as: MICARDIS HCT       Allergies: No Known Allergies  Family History: Family History  Problem Relation Age of Onset  . Hypertension Father   .  Hypercholesterolemia Father   . Hypertension Mother   . Hypercholesterolemia Mother     Social History:  reports that he has never smoked. He has never used smokeless tobacco. He reports current alcohol use. He reports that he does not use drugs.  ROS: Pertinent ROS in HPI  Physical Exam: BP 138/84   Pulse 76   Ht 5' 11"  (1.803 m)   Wt 220 lb (99.8 kg)   BMI 30.68 kg/m   Constitutional:  Well nourished. Alert and oriented, No acute distress. HEENT: [place] AT, mask in place.  Trachea  midline Cardiovascular: No clubbing, cyanosis, or edema. Respiratory: Normal respiratory effort, no increased work of breathing. Neurologic: Grossly intact, no focal deficits, moving all 4 extremities. Psychiatric: Normal mood and affect.  Laboratory Data: Urinalysis Component     Latest Ref Rng & Units 12/28/2020  Specific Gravity, UA     1.005 - 1.030 1.015  pH, UA     5.0 - 7.5 7.0  Color, UA     Yellow Orange  Appearance Ur     Clear Cloudy (A)  Leukocytes,UA     Negative Trace (A)  Protein,UA     Negative/Trace Negative  Glucose, UA     Negative Negative  Ketones, UA     Negative Trace (A)  RBC, UA     Negative 2+ (A)  Bilirubin, UA     Negative Negative  Urobilinogen, Ur     0.2 - 1.0 mg/dL 0.2  Nitrite, UA     Negative Negative  Microscopic Examination      See below:   Component     Latest Ref Rng & Units 12/28/2020  WBC, UA     0 - 5 /hpf 0-5  RBC     0 - 2 /hpf >30 (A)  Epithelial Cells (non renal)     0 - 10 /hpf 0-10  Casts     None seen /lpf Present (A)  Cast Type     N/A Hyaline casts  Bacteria, UA     None seen/Few None seen   WBC (White Blood Cell Count) 4.1 - 10.2 10^3/uL 7.2   RBC (Red Blood Cell Count) 4.69 - 6.13 10^6/uL 5.35   Hemoglobin 14.1 - 18.1 gm/dL 15.7   Hematocrit 40.0 - 52.0 % 47.1   MCV (Mean Corpuscular Volume) 80.0 - 100.0 fl 88.0   MCH (Mean Corpuscular Hemoglobin) 27.0 - 31.2 pg 29.3   MCHC (Mean Corpuscular Hemoglobin Concentration) 32.0 - 36.0 gm/dL 33.3   Platelet Count 150 - 450 10^3/uL 220   RDW-CV (Red Cell Distribution Width) 11.6 - 14.8 % 13.2   MPV (Mean Platelet Volume) 9.4 - 12.4 fl 9.3Low   Neutrophils 1.50 - 7.80 10^3/uL 4.30   Lymphocytes 1.00 - 3.60 10^3/uL 2.20   Mixed Count 0.10 - 0.90 10^3/uL 0.70   Neutrophil % 32.0 - 70.0 % 59.7   Lymphocyte % 10.0 - 50.0 % 30.5   Mixed % 3.0 - 14.4 % 9.8   Resulting Agency  [facility] - [facility]  Specimen Collected: 08/22/20 08:27 Last Resulted: 08/22/20  09:25  Received From: [place]  Result Received: 12/26/20 11:25   Glucose 70 - 110 mg/dL 96   Sodium 136 - 145 mmol/L 139   Potassium 3.6 - 5.1 mmol/L 4.1   Chloride 97 - 109 mmol/L 100   Carbon Dioxide (CO2) 22.0 - 32.0 mmol/L 29.6   Urea Nitrogen (BUN) 7 - 25 mg/dL 14  Creatinine 0.7 - 1.3 mg/dL 0.8   Glomerular Filtration Rate (eGFR), MDRD Estimate >60 mL/min/1.73sq m 95   Calcium 8.7 - 10.3 mg/dL 9.8   AST  8 - 39 U/L 27   ALT  6 - 57 U/L 37   Alk Phos (alkaline Phosphatase) 34 - 104 U/L 53   Albumin 3.5 - 4.8 g/dL 4.6   Bilirubin, Total 0.3 - 1.2 mg/dL 1.9High   Protein, Total 6.1 - 7.9 g/dL 6.9   A/G Ratio 1.0 - 5.0 gm/dL 2.0   Resulting [facility] - [facility]  Specimen Collected: 08/22/20 08:27 Last Resulted: 08/22/20 12:16  Received From: [place]  Result Received: 12/26/20 11:25   Hemoglobin A1C 4.2 - 5.6 % 5.4   Average Blood Glucose (Calc) mg/dL 108   Resulting Agency  [facility] - [facility]   Narrative  Normal Range:  4.2 - 5.6%  Increased Risk: 5.7 - 6.4%  Diabetes:    >= 6.5%  Glycemic Control for adults with diabetes: <7%  Specimen Collected: 08/22/20 08:27 Last Resulted: 08/22/20 13:34  Received From: [place]  Result Received: 12/26/20 11:25   Cholesterol, Total 100 - 200 mg/dL 235High   Triglyceride 35 - 199 mg/dL 98   HDL (High Density Lipoprotein) Cholesterol 29.0 - 71.0 mg/dL 97.9High   LDL Calculated 0 - 130 mg/dL 118   VLDL Cholesterol mg/dL 20   Cholesterol/HDL Ratio  2.4   Resulting Agency  [facility] - LAB  Specimen Collected: 08/22/20 08:27 Last Resulted: 08/22/20 12:16  Received From: [place]  Result Received: 12/26/20 11:25   PSA (Prostate Specific Antigen), Total 0.10 - 4.00 ng/mL 0.53   Resulting Agency  [place] - LAB   Narrative Performed by [facility] - LAB Test results were determined  with Beckman Coulter Hybritech Assay. Values obtained with different assay methods cannot be used interchangeably in serial testing. Assay results should not be interpreted as absolute evidence of the presence or absence of malignant disease Specimen Collected: 02/21/20 08:32 Last Resulted: 02/21/20 13:08  Received From: [place]  Result Received: 12/26/20 11:25    Pertinent Imaging: CLINICAL DATA:  Right flank pain, gross hematuria, history of nephrolithiasis  EXAM: CT ABDOMEN AND PELVIS WITHOUT CONTRAST  TECHNIQUE: Multidetector CT imaging of the abdomen and pelvis was performed following the standard protocol without IV contrast.  COMPARISON:  None.  FINDINGS: Lower chest: Coronary calcifications. No pleural or pericardial effusion.  Hepatobiliary: Several subcentimeter partially calcified stones in the dependent aspect of the nondilated gallbladder. No focal liver lesion or biliary ductal dilatation.  Pancreas: Unremarkable. No pancreatic ductal dilatation or surrounding inflammatory changes.  Spleen: Normal in size without focal abnormality.  Adrenals/Urinary Tract: Adrenal glands unremarkable.  0.8 cm proximal right ureteral calculus with moderate right hydronephrosis. 2.7 cm probable cyst, mid right kidney. 2.1 cm probable cyst, superior left kidney. 4.6 cm probable cyst, mid left kidney. Urinary bladder incompletely distended.  Stomach/Bowel: Stomach is nondistended. Small bowel decompressed. Appendix not identified. No pericecal inflammatory/edematous change. The colon is nondilated, unremarkable.  Vascular/Lymphatic: Mild scattered aortic and iliofemoral atheromatous plaque without aneurysm. No abdominal or pelvic adenopathy.  Reproductive: Mild prostate enlargement with central coarse calcifications.  Other: No ascites.  No free air.  Musculoskeletal: Mild right retroperitoneal inflammatory/edematous changes along the  ureter. Right hip arthroplasty hardware partially visualized. No acute fracture or worrisome bone lesion.  IMPRESSION: 1. 0.8 cm obstructing proximal right  ureteral calculus with moderate hydronephrosis. 2. cholelithiasis 3. Aortoiliac and coronary  atherosclerosis (ICD10-I70.0).   Electronically Signed   By: Lucrezia Europe M.D.   On: 01/02/2021 15:16 I have independently reviewed the films.  Skin to stone distance 16 cm.  Stone density <1500 HU See HPI.      Assessment & Plan:    1. Right ureteral stone -Explained to the patient that since their stone is  ?10 mm, it is in within AUA Guidelines to continue MET with tamsulosin and pushing fluids for 4 to 6 weeks, but the success rate was below 20%  -We discussed various treatment options for urolithiasis including observation with or without medical expulsive therapy, shockwave lithotripsy (SWL), ureteroscopy and laser lithotripsy with stent placement.  -We discussed that management is based on stone size, location, density, patient co-morbidities, and patient preference.   -ESWL has a lower stone free rate in a single procedure, but also a lower complication rate compared to ureteroscopy and avoids a stent and associated stent related symptoms. Possible complications include renal hematoma, steinstrasse, and need for additional treatment. -Ureteroscopy with laser lithotripsy and stent placement has a higher stone free rate than SWL in a single procedure, however increased complication rate including possible infection, ureteral injury, bleeding, and stent related morbidity. Common stent related symptoms include dysuria, urgency/frequency, and flank pain -He was hesitant about undergoing a procedure under general in the OR and having a stent, so he would like to try right ESWL first for his stone -script given for tamuslosin 0.4 mg daily    Return for right ESWL .  These notes generated with voice recognition software. I apologize for  typographical errors.  Zara Council, PA-C  [facility] [address]  [place], [place] [address] [phone]

## 2021-01-03 ENCOUNTER — Ambulatory Visit: Payer: Medicare PPO | Admitting: Urology

## 2021-01-03 ENCOUNTER — Other Ambulatory Visit: Payer: Self-pay | Admitting: Urology

## 2021-01-03 ENCOUNTER — Encounter: Payer: Self-pay | Admitting: Urology

## 2021-01-03 VITALS — BP 138/84 | HR 76 | Ht 71.0 in | Wt 220.0 lb

## 2021-01-03 DIAGNOSIS — N201 Calculus of ureter: Secondary | ICD-10-CM | POA: Diagnosis not present

## 2021-01-03 LAB — CULTURE, URINE COMPREHENSIVE

## 2021-01-03 MED ORDER — TAMSULOSIN HCL 0.4 MG PO CAPS: 0.4000 mg | ORAL_CAPSULE | Freq: Every day | ORAL | 0 refills | Status: DC

## 2021-01-04 ENCOUNTER — Other Ambulatory Visit: Payer: Self-pay | Admitting: *Deleted

## 2021-01-04 MED ORDER — CIPROFLOXACIN HCL 250 MG PO TABS: 250.0000 mg | ORAL_TABLET | Freq: Two times a day (BID) | ORAL | 0 refills | Status: DC

## 2021-01-04 MED ORDER — CEPHALEXIN 250 MG PO CAPS: 500.0000 mg | ORAL_CAPSULE | ORAL | Status: AC

## 2021-01-05 ENCOUNTER — Telehealth: Payer: Self-pay

## 2021-01-05 DIAGNOSIS — A498 Other bacterial infections of unspecified site: Secondary | ICD-10-CM

## 2021-01-05 MED ORDER — LEVOFLOXACIN 500 MG PO TABS: 500.0000 mg | ORAL_TABLET | Freq: Every day | ORAL | 0 refills | Status: AC

## 2021-01-05 NOTE — Telephone Encounter (Signed)
Patient presents in clinic to receptionist desk and states that he has began taking Cipro as prescribed, however notes an increase in urinary frequency. Patient denies fever, chills, pain, n/v. States that cipro has not worked well for him in the past. Consulted with [employer] via phone who gave verbal order to d/s Cipro and send RX for Levofloxacin 500mg  1 QD x7 days. RX sent. Patient informed and given stone precautions on when to seek care in the ED. Patient voiced understanding.

## 2021-01-10 MED ORDER — DIAZEPAM 5 MG PO TABS: 10.0000 mg | ORAL_TABLET | ORAL | Status: AC

## 2021-01-10 MED ORDER — ONDANSETRON HCL 4 MG/2ML IJ SOLN
4.0000 mg | Freq: Once | INTRAMUSCULAR | Status: AC | PRN
  Administered 2021-01-11: 4 mg via INTRAVENOUS

## 2021-01-10 MED ORDER — SODIUM CHLORIDE 0.9 % IV SOLN
INTRAVENOUS | Status: DC
Start: 2021-01-10 — End: 2021-01-11

## 2021-01-10 MED ORDER — DIPHENHYDRAMINE HCL 25 MG PO CAPS
25.0000 mg | ORAL_CAPSULE | ORAL | Status: AC
Start: 2021-01-10 — End: 2021-01-11

## 2021-01-11 ENCOUNTER — Encounter: Payer: Self-pay | Admitting: Urology

## 2021-01-11 ENCOUNTER — Ambulatory Visit
Admission: RE | Admit: 2021-01-11 | Discharge: 2021-01-11 | Disposition: A | Discharge status: Home / Self Care | Payer: Medicare PPO | Attending: Urology | Admitting: Urology

## 2021-01-11 ENCOUNTER — Other Ambulatory Visit: Payer: Self-pay

## 2021-01-11 ENCOUNTER — Ambulatory Visit: Payer: Medicare PPO

## 2021-01-11 ENCOUNTER — Encounter
Admission: RE | Disposition: A | Discharge status: Home / Self Care | Payer: Self-pay | Source: Home / Self Care | Attending: Urology

## 2021-01-11 DIAGNOSIS — N201 Calculus of ureter: Secondary | ICD-10-CM | POA: Diagnosis present

## 2021-01-11 HISTORY — PX: EXTRACORPOREAL SHOCK WAVE LITHOTRIPSY: SHX1557

## 2021-01-11 SURGERY — LITHOTRIPSY, ESWL
Anesthesia: Moderate Sedation | Laterality: Right

## 2021-01-11 MED ORDER — DIPHENHYDRAMINE HCL 25 MG PO CAPS
ORAL_CAPSULE | ORAL | Status: AC
  Administered 2021-01-11: 25 mg via ORAL
  Filled 2021-01-11: qty 1

## 2021-01-11 MED ORDER — ONDANSETRON HCL 4 MG/2ML IJ SOLN
INTRAMUSCULAR | Status: AC
  Filled 2021-01-11: qty 2

## 2021-01-11 MED ORDER — DIAZEPAM 5 MG PO TABS
ORAL_TABLET | ORAL | Status: AC
  Administered 2021-01-11: 10 mg via ORAL
  Filled 2021-01-11: qty 2

## 2021-01-11 NOTE — Brief Op Note (Signed)
01/11/2021  8:35 AM  PATIENT:  Arthur Jones  70 y.o. male  PRE-OPERATIVE DIAGNOSIS:  Right 95mm distal ureteral stone  POST-OPERATIVE DIAGNOSIS:  Same  PROCEDURE:  Procedure(s): EXTRACORPOREAL SHOCK WAVE LITHOTRIPSY (ESWL) (Right)  SURGEON:  Surgeon(s) and Role:    * Billey Co, MD - Primary  ANESTHESIA: Conscious Sedation  EBL:  None  Drains: None  Specimen: None  Findings:  1. Stone smudged nicely, tolerated SWL well  DISPO: Flomax, pain meds PRN, RTC 2 weeks KUB  Nickolas Madrid, MD 01/11/2021

## 2021-01-11 NOTE — Discharge Instructions (Signed)
AMBULATORY SURGERY  DISCHARGE INSTRUCTIONS   1) The drugs that you were given will stay in your system until tomorrow so for the next 24 hours you should not:  A) Drive an automobile B) Make any legal decisions C) Drink any alcoholic beverage   2) You may resume regular meals tomorrow.  Today it is better to start with liquids and gradually work up to solid foods.  You may eat anything you prefer, but it is better to start with liquids, then soup and crackers, and gradually work up to solid foods.   3) Please notify your doctor immediately if you have any unusual bleeding, trouble breathing, redness and pain at the surgery site, drainage, fever, or pain not relieved by medication.    4) Additional Instructions:  Follow [facility] stone instruction    Please contact your physician with any problems or Same Day Surgery at [phone], Monday through Friday 6 am to 4 pm, or  at [facility] number at [phone].

## 2021-01-11 NOTE — Interval H&P Note (Signed)
UROLOGY H&P UPDATE  Agree with prior H&P dated 01/03/21. 43mm right ureteral stone, migrated distally on KUB this AM. Pre-op UA bland but culture grew pseudomonas, asymptomatic, has been on culture appropriate levaquin.  Cardiac: RRR Lungs: CTA bilaterally  Laterality: RIGHT Procedure: Shockwave lithotripsy  Urine: 4/28 asymptomatic pseudomonas, was treated with culture appropriate levaquin  Informed consent obtained, we specifically discussed the risks of bleeding/hematoma, infection, post-operative pain, steinstrasse/obstructive fragments, need for additional procedures.  Billey Co, MD 01/11/2021

## 2021-01-24 ENCOUNTER — Other Ambulatory Visit: Payer: Self-pay | Admitting: Family Medicine

## 2021-01-24 ENCOUNTER — Ambulatory Visit: Payer: Self-pay | Admitting: Urology

## 2021-01-24 ENCOUNTER — Ambulatory Visit
Admission: RE | Admit: 2021-01-24 | Discharge: 2021-01-24 | Disposition: A | Discharge status: Home / Self Care | Payer: Medicare PPO | Attending: Urology | Admitting: Urology

## 2021-01-24 ENCOUNTER — Ambulatory Visit
Admission: RE | Admit: 2021-01-24 | Discharge: 2021-01-24 | Disposition: A | Discharge status: Home / Self Care | Payer: Medicare PPO | Source: Ambulatory Visit | Attending: Urology | Admitting: Urology

## 2021-01-24 DIAGNOSIS — N200 Calculus of kidney: Secondary | ICD-10-CM | POA: Insufficient documentation

## 2021-01-24 NOTE — Progress Notes (Signed)
01/25/2021 10:00 AM   Arthur Jones [birth year] [id number]  Referring provider: Derinda Late, MD [phone] [facility] [place],  [place] [address]  Chief Complaint  Patient presents with  . Nephrolithiasis    2wk post litho w/KUB   Urological history: 1. Urinary retention -episode of retention 2018  2. BPH with LU TS -I PSS 6/2 -PVR 115 mL -cysto 2019 Moderate prostate enlargement with inflammatory change otherwise negative cystoscopy -managed with tamsulosin 0.4 mg daily and finasteride 5 mg daily   3. ED -contributing factors of age, BPH, HTN and HLD -SHIM 9 -managed with sildenafil 100 mg, on-demand-dosing  HPI: Arthur Jones is a 70 y.o. male who presents today for follow up from ESWL  He underwent ESWL on Jan 11, 2021 for a right 8 mm distal ureteral stone with Dr. Diamantina Providence.  His postprocedural course was as expected and uneventful.  He brings in a fragment for analysis.    KUB 8 mm stone is no longer visible   UA 3-10 RBC's     PMH: Past Medical History:  Diagnosis Date  . Erectile dysfunction   . High cholesterol   . Hypertension   . Urinary retention     Surgical History: Past Surgical History:  Procedure Laterality Date  . COLONOSCOPY    . EXTRACORPOREAL SHOCK WAVE LITHOTRIPSY Right 01/11/2021   Procedure: EXTRACORPOREAL SHOCK WAVE LITHOTRIPSY (ESWL);  Surgeon: Billey Co, MD;  Location: [facility] ORS;  Service: Urology;  Laterality: Right;  . NO PAST SURGERIES    . TONSILLECTOMY     as a child  . TOTAL HIP ARTHROPLASTY Right 01/18/2019   Procedure: TOTAL HIP ARTHROPLASTY - RIGHT;  Surgeon: Dereck Leep, MD;  Location: [facility] ORS;  Service: Orthopedics;  Laterality: Right;    Home Medications:  Allergies as of 01/25/2021   No Known Allergies     Medication List       Accurate as of Jan 25, 2021 10:00 AM. If you have any questions, ask your nurse or doctor.        STOP taking these  medications   HYDROcodone-acetaminophen 5-325 MG tablet Commonly known as: NORCO/VICODIN Stopped by: Zara Council, PA-C     TAKE these medications   amLODipine 10 MG tablet Commonly known as: NORVASC Take 10 mg by mouth at bedtime.   finasteride 5 MG tablet Commonly known as: PROSCAR Take 1 tablet (5 mg total) by mouth daily.   fluticasone 50 MCG/ACT nasal spray Commonly known as: FLONASE Place 1 spray into the nose daily as needed for allergies.   sildenafil 100 MG tablet Commonly known as: VIAGRA Take 1 tablet (100 mg total) by mouth daily as needed for erectile dysfunction. Take two hours prior to intercourse on an empty stomach   simvastatin 40 MG tablet Commonly known as: ZOCOR Take 40 mg by mouth every evening.   tamsulosin 0.4 MG Caps capsule Commonly known as: FLOMAX Take 1 capsule (0.4 mg total) by mouth daily.   telmisartan-hydrochlorothiazide 80-12.5 MG tablet Commonly known as: MICARDIS HCT       Allergies: No Known Allergies  Family History: Family History  Problem Relation Age of Onset  . Hypertension Father   . Hypercholesterolemia Father   . Hypertension Mother   . Hypercholesterolemia Mother     Social History:  reports that he has never smoked. He has never used smokeless tobacco. He reports current alcohol use. He reports that he  does not use drugs.  ROS: Pertinent ROS in HPI  Physical Exam: BP 106/68   Pulse 81   Ht 5\' 11"  (1.803 m)   Wt 220 lb (99.8 kg)   BMI 30.68 kg/m   Constitutional:  Well nourished. Alert and oriented, No acute distress. HEENT: [place] AT, moist mucus membranes.  Trachea midline Cardiovascular: No clubbing, cyanosis, or edema. Respiratory: Normal respiratory effort, no increased work of breathing. Neurologic: Grossly intact, no focal deficits, moving all 4 extremities. Psychiatric: Normal mood and affect.  Laboratory Data: Urinalysis Component     Latest Ref Rng & Units 01/25/2021  Specific Gravity, UA      1.005 - 1.030 1.020  pH, UA     5.0 - 7.5 6.0  Color, UA     Yellow Yellow  Appearance Ur     Clear Clear  Leukocytes,UA     Negative Negative  Protein,UA     Negative/Trace Negative  Glucose, UA     Negative Negative  Ketones, UA     Negative Negative  RBC, UA     Negative Negative  Bilirubin, UA     Negative Negative  Urobilinogen, Ur     0.2 - 1.0 mg/dL 0.2  Nitrite, UA     Negative Negative  Microscopic Examination      See below:   Component     Latest Ref Rng & Units 01/25/2021  WBC, UA     0 - 5 /hpf 0-5  RBC     0 - 2 /hpf 3-10 (A)  Epithelial Cells (non renal)     0 - 10 /hpf 0-10  Bacteria, UA     None seen/Few None seen  I have reviewed the labs.    Pertinent Imaging: CLINICAL DATA:  Chronic history of kidney stones. History of lithotripsy on the right 01/11/2021  EXAM: ABDOMEN - 1 VIEW  COMPARISON:  01/11/2021  FINDINGS: Bowel gas pattern is normal. No sign urinary tract calcifications. As deferens calcification incidentally noted in the pelvis. Mild lower lumbar degenerative changes.  IMPRESSION: No visible urinary tract stone.   Electronically Signed   By: Nelson Chimes M.D.   On: 01/25/2021 13:08 I have independently reviewed the films.  See HPI.   Assessment & Plan:    1. Right ureteral stone -stone no longer visible on today's KUB -stone sent for analysis  2. Microscopic hematuria -UA 3-10 RBC's -UA in one month to ensure micro heme resolves  3. BPH  -keep follow up with Dr. Diamantina Providence in November    Return in about 4 weeks (around 02/22/2021) for 91mo UA drop off-recheck hematuria.  These notes generated with voice recognition software. I apologize for typographical errors.  Zara Council, PA-C  [facility] [address]  [place], [place] [address] [phone]

## 2021-01-25 ENCOUNTER — Other Ambulatory Visit (HOSPITAL_COMMUNITY): Payer: Self-pay | Admitting: Student

## 2021-01-25 ENCOUNTER — Ambulatory Visit
Admission: RE | Admit: 2021-01-25 | Discharge: 2021-01-25 | Disposition: A | Discharge status: Home / Self Care | Payer: Medicare PPO | Source: Ambulatory Visit | Attending: Student | Admitting: Student

## 2021-01-25 ENCOUNTER — Ambulatory Visit (INDEPENDENT_AMBULATORY_CARE_PROVIDER_SITE_OTHER): Payer: Medicare PPO | Admitting: Urology

## 2021-01-25 ENCOUNTER — Other Ambulatory Visit: Payer: Self-pay

## 2021-01-25 ENCOUNTER — Other Ambulatory Visit: Payer: Self-pay | Admitting: Student

## 2021-01-25 ENCOUNTER — Encounter: Payer: Self-pay | Admitting: Urology

## 2021-01-25 VITALS — BP 106/68 | HR 81 | Ht 71.0 in | Wt 220.0 lb

## 2021-01-25 DIAGNOSIS — R3129 Other microscopic hematuria: Secondary | ICD-10-CM

## 2021-01-25 DIAGNOSIS — M79604 Pain in right leg: Secondary | ICD-10-CM | POA: Diagnosis present

## 2021-01-25 DIAGNOSIS — M7989 Other specified soft tissue disorders: Secondary | ICD-10-CM | POA: Diagnosis present

## 2021-01-25 DIAGNOSIS — N201 Calculus of ureter: Secondary | ICD-10-CM

## 2021-01-25 LAB — MICROSCOPIC EXAMINATION: Bacteria, UA: NONE SEEN

## 2021-01-25 LAB — URINALYSIS, COMPLETE
Bilirubin, UA: NEGATIVE
Glucose, UA: NEGATIVE
Ketones, UA: NEGATIVE
Leukocytes,UA: NEGATIVE
Nitrite, UA: NEGATIVE
Protein,UA: NEGATIVE
RBC, UA: NEGATIVE
Specific Gravity, UA: 1.02 (ref 1.005–1.030)
Urobilinogen, Ur: 0.2 mg/dL (ref 0.2–1.0)
pH, UA: 6 (ref 5.0–7.5)

## 2021-03-01 ENCOUNTER — Other Ambulatory Visit: Payer: Medicare PPO

## 2021-03-01 ENCOUNTER — Other Ambulatory Visit: Payer: Self-pay

## 2021-03-01 DIAGNOSIS — R3129 Other microscopic hematuria: Secondary | ICD-10-CM

## 2021-03-02 LAB — URINALYSIS, COMPLETE
Bilirubin, UA: NEGATIVE
Glucose, UA: NEGATIVE
Ketones, UA: NEGATIVE
Leukocytes,UA: NEGATIVE
Nitrite, UA: NEGATIVE
Protein,UA: NEGATIVE
RBC, UA: NEGATIVE
Specific Gravity, UA: 1.02 (ref 1.005–1.030)
Urobilinogen, Ur: 0.2 mg/dL (ref 0.2–1.0)
pH, UA: 5.5 (ref 5.0–7.5)

## 2021-03-02 LAB — MICROSCOPIC EXAMINATION
Bacteria, UA: NONE SEEN
Epithelial Cells (non renal): NONE SEEN /hpf (ref 0–10)

## 2021-04-05 ENCOUNTER — Other Ambulatory Visit: Payer: Self-pay | Admitting: Urology

## 2021-04-19 ENCOUNTER — Other Ambulatory Visit: Payer: Self-pay | Admitting: Urology

## 2021-07-04 ENCOUNTER — Other Ambulatory Visit: Payer: Self-pay | Admitting: Urology

## 2021-07-11 ENCOUNTER — Encounter: Payer: Medicare PPO | Admitting: Dermatology

## 2021-07-12 ENCOUNTER — Other Ambulatory Visit: Payer: Self-pay | Admitting: *Deleted

## 2021-07-12 ENCOUNTER — Ambulatory Visit: Payer: Self-pay | Admitting: Urology

## 2021-07-12 DIAGNOSIS — N201 Calculus of ureter: Secondary | ICD-10-CM

## 2021-07-13 ENCOUNTER — Encounter: Payer: Self-pay | Admitting: Urology

## 2021-08-02 ENCOUNTER — Ambulatory Visit: Payer: Medicare PPO | Admitting: Urology

## 2021-08-02 ENCOUNTER — Ambulatory Visit
Admission: RE | Admit: 2021-08-02 | Discharge: 2021-08-02 | Disposition: A | Discharge status: Home / Self Care | Payer: Medicare PPO | Attending: Urology | Admitting: Urology

## 2021-08-02 ENCOUNTER — Encounter: Payer: Self-pay | Admitting: Urology

## 2021-08-02 ENCOUNTER — Other Ambulatory Visit: Payer: Self-pay

## 2021-08-02 ENCOUNTER — Ambulatory Visit
Admission: RE | Admit: 2021-08-02 | Discharge: 2021-08-02 | Disposition: A | Discharge status: Home / Self Care | Payer: Medicare PPO | Source: Ambulatory Visit | Attending: Urology | Admitting: Urology

## 2021-08-02 VITALS — BP 125/81 | HR 96 | Ht 71.0 in | Wt 220.0 lb

## 2021-08-02 DIAGNOSIS — N529 Male erectile dysfunction, unspecified: Secondary | ICD-10-CM | POA: Diagnosis not present

## 2021-08-02 DIAGNOSIS — N138 Other obstructive and reflux uropathy: Secondary | ICD-10-CM

## 2021-08-02 DIAGNOSIS — N401 Enlarged prostate with lower urinary tract symptoms: Secondary | ICD-10-CM | POA: Diagnosis not present

## 2021-08-02 DIAGNOSIS — N201 Calculus of ureter: Secondary | ICD-10-CM

## 2021-08-02 DIAGNOSIS — N200 Calculus of kidney: Secondary | ICD-10-CM

## 2021-08-02 LAB — BLADDER SCAN AMB NON-IMAGING

## 2021-08-02 NOTE — Patient Instructions (Signed)
Dietary Guidelines to Help Prevent Kidney Stones Kidney stones are deposits of minerals and salts that form inside your kidneys. Your risk of developing kidney stones may be greater depending on your diet, your lifestyle, the medicines you take, and whether you have certain medical conditions. Most people can lower their chances of developing kidney stones by following the instructions below. Your dietitian may give you more specific instructions depending on your overall health and the type of kidney stones you tend to develop. What are tips for following this plan? Reading food labels  Choose foods with "no salt added" or "low-salt" labels. Limit your salt (sodium) intake to less than 1,500 mg a day. Choose foods with calcium for each meal and snack. Try to eat about 300 mg of calcium at each meal. Foods that contain 200-500 mg of calcium a serving include: 8 oz (237 mL) of milk, calcium-fortifiednon-dairy milk, and calcium-fortifiedfruit juice. Calcium-fortified means that calcium has been added to these drinks. 8 oz (237 mL) of kefir, yogurt, and soy yogurt. 4 oz (114 g) of tofu. 1 oz (28 g) of cheese. 1 cup (150 g) of dried figs. 1 cup (91 g) of cooked broccoli. One 3 oz (85 g) can of sardines or mackerel. Most people need 1,000-1,500 mg of calcium a day. Talk to your dietitian about how much calcium is recommended for you. Shopping Buy plenty of fresh fruits and vegetables. Most people do not need to avoid fruits and vegetables, even if these foods contain nutrients that may contribute to kidney stones. When shopping for convenience foods, choose: Whole pieces of fruit. Pre-made salads with dressing on the side. Low-fat fruit and yogurt smoothies. Avoid buying frozen meals or prepared deli foods. These can be high in sodium. Look for foods with live cultures, such as yogurt and kefir. Choose high-fiber grains, such as whole-wheat breads, oat bran, and wheat cereals. Cooking Do not add  salt to food when cooking. Place a salt shaker on the table and allow each person to add his or her own salt to taste. Use vegetable protein, such as beans, textured vegetable protein (TVP), or tofu, instead of meat in pasta, casseroles, and soups. Meal planning Eat less salt, if told by your dietitian. To do this: Avoid eating processed or pre-made food. Avoid eating fast food. Eat less animal protein, including cheese, meat, poultry, or fish, if told by your dietitian. To do this: Limit the number of times you have meat, poultry, fish, or cheese each week. Eat a diet free of meat at least 2 days a week. Eat only one serving each day of meat, poultry, fish, or seafood. When you prepare animal protein, cut pieces into small portion sizes. For most meat and fish, one serving is about the size of the palm of your hand. Eat at least five servings of fresh fruits and vegetables each day. To do this: Keep fruits and vegetables on hand for snacks. Eat one piece of fruit or a handful of berries with breakfast. Have a salad and fruit at lunch. Have two kinds of vegetables at dinner. Limit foods that are high in a substance called oxalate. These include: Spinach (cooked), rhubarb, beets, sweet potatoes, and Swiss chard. Peanuts. Potato chips, french fries, and baked potatoes with skin on. Nuts and nut products. Chocolate. If you regularly take a diuretic medicine, make sure to eat at least 1 or 2 servings of fruits or vegetables that are high in potassium each day. These include: Avocado. Banana. Orange, prune,   carrot, or tomato juice. Baked potato. Cabbage. Beans and split peas. Lifestyle  Drink enough fluid to keep your urine pale yellow. This is the most important thing you can do. Spread your fluid intake throughout the day. If you drink alcohol: Limit how much you use to: 0-1 drink a day for women who are not pregnant. 0-2 drinks a day for men. Be aware of how much alcohol is in your  drink. In the [place] one drink equals one 12 oz bottle of beer (355 mL), one 5 oz glass of wine (148 mL), or one 1 oz glass of hard liquor (44 mL). Lose weight if told by your health care provider. Work with your dietitian to find an eating plan and weight loss strategies that work best for you. General information Talk to your health care provider and dietitian about taking daily supplements. You may be told the following depending on your health and the cause of your kidney stones: Not to take supplements with vitamin C. To take a calcium supplement. To take a daily probiotic supplement. To take other supplements such as magnesium, fish oil, or vitamin B6. Take over-the-counter and prescription medicines only as told by your health care provider. These include supplements. What foods should I limit? Limit your intake of the following foods, or eat them as told by your dietitian. Vegetables Spinach. Rhubarb. Beets. Canned vegetables. Pickles. Olives. Baked potatoes with skin. Grains Wheat bran. Baked goods. Salted crackers. Cereals high in sugar. Meats and other proteins Nuts. Nut butters. Large portions of meat, poultry, or fish. Salted, precooked, or cured meats, such as sausages, meat loaves, and hot dogs. Dairy Cheese. Beverages Regular soft drinks. Regular vegetable juice. Seasonings and condiments Seasoning blends with salt. Salad dressings. Soy sauce. Ketchup. Barbecue sauce. Other foods Canned soups. Canned pasta sauce. Casseroles. Pizza. Lasagna. Frozen meals. Potato chips. French fries. The items listed above may not be a complete list of foods and beverages you should limit. Contact a dietitian for more information. What foods should I avoid? Talk to your dietitian about specific foods you should avoid based on the type of kidney stones you have and your overall health. Fruits Grapefruit. The item listed above may not be a complete list of foods and beverages you should  avoid. Contact a dietitian for more information. Summary Kidney stones are deposits of minerals and salts that form inside your kidneys. You can lower your risk of kidney stones by making changes to your diet. The most important thing you can do is drink enough fluid. Drink enough fluid to keep your urine pale yellow. Talk to your dietitian about how much calcium you should have each day, and eat less salt and animal protein as told by your dietitian. This information is not intended to replace advice given to you by your health care provider. Make sure you discuss any questions you have with your health care provider. Document Revised: 08/12/2019 Document Reviewed: 08/12/2019 Elsevier Patient Education  2022 Elsevier Inc.  

## 2021-08-02 NOTE — Progress Notes (Signed)
   08/02/2021 2:13 PM   Arthur Jones 1951/08/14 [id number]  Reason for visit: Follow up BPH, nephrolithiasis, ED  HPI: 70 year old very healthy male here for the above issues.  In terms of his BPH he has a history of urinary retention in 2018 and 1 episode of prostatitis.  His symptoms are currently very well controlled on maximal medical therapy with finasteride and Flomax.  We previously discussed outlet procedures, and he is not bothered enough to consider them at this time.  PSA previously was normal at 0.66 and we opted to discontinue PSA screening per the guideline recommendations.  PVRs have been normal, and is normal again today at 19 mL.  In terms of his ED, symptoms are currently well controlled on sildenafil as needed.  He underwent a successful right-sided shockwave lithotripsy for an 8 mm right distal ureteral stone in May 2022, and no residual fragments on postop KUB.  He denies any flank pain or gross hematuria since then. We discussed general stone prevention strategies including adequate hydration with goal of producing 2.5 L of urine daily, increasing citric acid intake, increasing calcium intake during high oxalate meals, minimizing animal protein, and decreasing salt intake. Information about dietary recommendations given today.   Flomax and finasteride refilled, RTC 1 year PVR Sildenafil refilled   Billey Co, MD  [facility] [address] [place], [place] [address] [phone]

## 2021-08-15 ENCOUNTER — Telehealth: Payer: Self-pay

## 2021-08-15 NOTE — Telephone Encounter (Signed)
Pt called he has a new bump appeared on his face, he would like to have checked asap

## 2021-08-16 ENCOUNTER — Other Ambulatory Visit: Payer: Self-pay

## 2021-08-16 ENCOUNTER — Ambulatory Visit: Payer: Medicare PPO | Admitting: Dermatology

## 2021-08-16 ENCOUNTER — Encounter: Payer: Self-pay | Admitting: Dermatology

## 2021-08-16 DIAGNOSIS — D2239 Melanocytic nevi of other parts of face: Secondary | ICD-10-CM

## 2021-08-16 DIAGNOSIS — D485 Neoplasm of uncertain behavior of skin: Secondary | ICD-10-CM

## 2021-08-16 MED ORDER — MUPIROCIN 2 % EX OINT
TOPICAL_OINTMENT | CUTANEOUS | 0 refills | Status: DC
Start: 2021-08-16 — End: 2023-07-24

## 2021-08-16 NOTE — Patient Instructions (Addendum)

## 2021-08-16 NOTE — Progress Notes (Signed)
Follow-Up Visit   Subjective  Arthur Jones is a 70 y.o. male who presents for the following: Lesions (On the B/L nose and L cheek - appeared the day before yesterday, asymptomatic, but since they are new patient is concerned and would like them checked and removed today. ).   The following portions of the chart were reviewed this encounter and updated as appropriate:   Tobacco   Allergies   Meds   Problems   Med Hx   Surg Hx   Fam Hx       Review of Systems:  No other skin or systemic complaints except as noted in HPI or Assessment and Plan.  Objective  Well appearing patient in no apparent distress; mood and affect are within normal limits.  A focused examination was performed including the face. Relevant physical exam findings are noted in the Assessment and Plan.  R nasofacial at alar crease 0.7 cm pink papule.   L nasofacial at alar crease 0.5 cm pink papule.     L zygoma 0.8 cm pink papule.       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  Epidermal / dermal shaving  Lesion diameter (cm):  0.7 Informed consent: discussed and consent obtained   Timeout: patient name, date of birth, surgical site, and procedure verified   Procedure prep:  Patient was prepped and draped in usual sterile fashion Prep type:  Isopropyl alcohol Anesthesia: the lesion was anesthetized in a standard fashion   Anesthetic:  1% lidocaine w/ epinephrine 1-100,000 buffered w/ 8.4% NaHCO3 Instrument used: flexible razor blade   Hemostasis achieved with: pressure, aluminum chloride and electrodesiccation   Outcome: patient tolerated procedure well   Post-procedure details: sterile dressing applied and wound care instructions given   Dressing type: bandage and petrolatum    mupirocin ointment (BACTROBAN) 2 % Apply to healing wounds QD until healed.  Specimen 1 - Surgical pathology Differential Diagnosis: D48.5 r/o irritated nevus cyst  vs other Check Margins: No  L nasofacial at alar crease  Epidermal / dermal shaving  Lesion diameter (cm):  0.5 Informed consent: discussed and consent obtained   Timeout: patient name, date of birth, surgical site, and procedure verified   Procedure prep:  Patient was prepped and draped in usual sterile fashion Prep type:  Isopropyl alcohol Anesthesia: the lesion was anesthetized in a standard fashion   Anesthetic:  1% lidocaine w/ epinephrine 1-100,000 buffered w/ 8.4% NaHCO3 Instrument used: flexible razor blade   Hemostasis achieved with: pressure, aluminum chloride and electrodesiccation   Outcome: patient tolerated procedure well   Post-procedure details: sterile dressing applied and wound care instructions given   Dressing type: bandage and petrolatum    Specimen 2 - Surgical pathology Differential Diagnosis: D48.5 r/o irritated nevus vs cyst vs other Check Margins: No  L zygoma  Epidermal / dermal shaving  Lesion diameter (cm):  0.8 Informed consent: discussed and consent obtained   Timeout: patient name, date of birth, surgical site, and procedure verified   Procedure prep:  Patient was prepped and draped in usual sterile fashion Prep type:  Isopropyl alcohol Anesthesia: the lesion was anesthetized in a standard fashion   Anesthetic:  1% lidocaine w/ epinephrine 1-100,000 buffered w/ 8.4% NaHCO3 Instrument used: flexible razor blade   Hemostasis achieved with: pressure, aluminum chloride and electrodesiccation   Outcome: patient tolerated procedure well   Post-procedure details: sterile dressing applied and wound care instructions given  Dressing type: bandage and petrolatum    Specimen 3 - Surgical pathology Differential Diagnosis: D48.5 r/o irritated nevus vs cyst vs other Check Margins: No  Patient reports history of recent change Start Mupirocin 2% ointment to healing wounds QD.   Return if symptoms worsen or fail to improve.  Luther Redo, CMA, am  acting as scribe for Forest Gleason, MD .  Documentation: I have reviewed the above documentation for accuracy and completeness, and I agree with the above.  Forest Gleason, MD

## 2021-11-20 ENCOUNTER — Other Ambulatory Visit: Payer: Self-pay

## 2021-11-20 ENCOUNTER — Encounter: Payer: Self-pay | Admitting: Urology

## 2021-11-20 ENCOUNTER — Other Ambulatory Visit
Admission: RE | Admit: 2021-11-20 | Discharge: 2021-11-20 | Disposition: A | Discharge status: Home / Self Care | Payer: Medicare PPO | Attending: Urology | Admitting: Urology

## 2021-11-20 ENCOUNTER — Ambulatory Visit: Payer: Medicare PPO | Admitting: Urology

## 2021-11-20 VITALS — BP 142/88 | HR 82 | Ht 71.0 in | Wt 220.0 lb

## 2021-11-20 DIAGNOSIS — R350 Frequency of micturition: Secondary | ICD-10-CM

## 2021-11-20 LAB — URINALYSIS, COMPLETE (UACMP) WITH MICROSCOPIC
Bilirubin Urine: NEGATIVE
Glucose, UA: NEGATIVE mg/dL
Hgb urine dipstick: NEGATIVE
Ketones, ur: NEGATIVE mg/dL
Leukocytes,Ua: NEGATIVE
Nitrite: NEGATIVE
Protein, ur: NEGATIVE mg/dL
Specific Gravity, Urine: 1.015 (ref 1.005–1.030)
pH: 6.5 (ref 5.0–8.0)

## 2021-11-20 NOTE — Progress Notes (Signed)
? ?  11/20/2021 ?1:47 PM  ? ?Arthur Jones ?1951-02-03 ?[id number] ? ?Reason for visit: Possible UTI ? ?HPI: ?71 year old male who I follow routinely for BPH, nephrolithiasis, and ED.  He is on maximal medical therapy with Flomax and finasteride.  PVR was normal at our last visit in December 2022.  He reports nocturia 7-8 times last night, which was increased significantly from his baseline of twice per night.  He denies any significant urinary symptoms today, no hematuria, dysuria, urgency, or feeling of incomplete emptying.  He wanted to come in today just to be sure that he did not have a UTI.  He did have some trouble sleeping last night which she feels may have contributed. ? ?Urinalysis today is benign with 0-5 WBCs, 0-5 RBCs, rare bacteria, negative leukocytes, nitrite negative.  Will send for culture. ? ?Reassurance provided, return precautions discussed, call with urine culture results ? ?Billey Co, MD ? ?[facility] ?[address], [address] ?[place],  [address] ?[phone] ? ? ?

## 2021-11-22 ENCOUNTER — Telehealth: Payer: Self-pay

## 2021-11-22 LAB — URINE CULTURE: Culture: NO GROWTH

## 2021-11-22 NOTE — Telephone Encounter (Signed)
Called pt informed him of the information below. Pt voiced understanding.  

## 2021-11-22 NOTE — Telephone Encounter (Signed)
-----   Message from Billey Co, MD sent at 11/22/2021  9:45 AM EDT ----- ?Urine culture shows no evidence of UTI ? ?Nickolas Madrid, MD ?11/22/2021 ? ? ?

## 2022-03-22 NOTE — TOC Progression Note (Signed)
Transition of Care [facility]) - Progression Note    Patient Details  Name: Arthur Jones MRN: [id number] Date of Birth: [birth year]  Transition of Care [facility]) CM/SW [place], RN Phone Number: 03/22/2022, 1:38 PM  Clinical Narrative:    Requested a 3 in 1 to be delivered to the home, Patient reports they have a rolling walker, Candy the patient's wife will be helping the patient and providing transportation        Expected Discharge Plan and Services                                                 Social Determinants of Health (SDOH) Interventions    Readmission Risk Interventions     No data to display

## 2022-03-24 NOTE — Discharge Instructions (Signed)
Instructions after Total Knee Replacement   Arthur Jones P. Briyanna Billingham, Jr., M.D.     Dept. of Orthopaedics & Sports Medicine  [facility]  [address]  [place], [place]  [address]  Phone: [phone]   Fax: [phone]    DIET: Drink plenty of non-alcoholic fluids. Resume your normal diet. Include foods high in fiber.  ACTIVITY:  You may use crutches or a walker with weight-bearing as tolerated, unless instructed otherwise. You may be weaned off of the walker or crutches by your Physical Therapist.  Do NOT place pillows under the knee. Anything placed under the knee could limit your ability to straighten the knee.   Continue doing gentle exercises. Exercising will reduce the pain and swelling, increase motion, and prevent muscle weakness.   Please continue to use the TED compression stockings for 6 weeks. You may remove the stockings at night, but should reapply them in the morning. Do not drive or operate any equipment until instructed.  WOUND CARE:  Continue to use the PolarCare or ice packs periodically to reduce pain and swelling. You may bathe or shower after the staples are removed at the first office visit following surgery.  MEDICATIONS: You may resume your regular medications. Please take the pain medication as prescribed on the medication. Do not take pain medication on an empty stomach. You have been given a prescription for a blood thinner (Lovenox or Coumadin). Please take the medication as instructed. (NOTE: After completing a 2 week course of Lovenox, take one Enteric-coated aspirin once a day. This along with elevation will help reduce the possibility of phlebitis in your operated leg.) Do not drive or drink alcoholic beverages when taking pain medications.  CALL THE OFFICE FOR: Temperature above 101 degrees Excessive bleeding or drainage on the dressing. Excessive swelling, coldness, or paleness of the toes. Persistent nausea and vomiting.  FOLLOW-UP:  You  should have an appointment to return to the office in 10-14 days after surgery. Arrangements have been made for continuation of Physical Therapy (either home therapy or outpatient therapy).   [facility] Department Directory         [email or web address]       [email or web address]          Cardiology  Appointments: [place] - [phone] [place] - [phone]  Endocrinology  Appointments: [place] - [phone] [place] - [phone]  Gastroenterology  Appointments: [place] - [phone] [place] - [phone]        General Surgery   Appointments: [place] - [phone]  Internal Medicine/Family Medicine  Appointments: [place] - [phone] [place] - [phone] [place] - [phone]  Metabolic and Weigh Loss Surgery  Appointments: [place] - [phone]        Neurology  Appointments: [place] - [phone] [place] - [phone]  Neurosurgery  Appointments: [place] - [phone]  Obstetrics & Gynecology  Appointments: [place] - [phone] [place] - [phone]        Pediatrics  Appointments: [place] - [phone] [place] - [phone]  Physiatry  Appointments: [place] -[phone]  Physical Therapy  Appointments: [place] - [phone] [place] - [phone]        Podiatry  Appointments: [place] - [phone] [place] - [phone]  Pulmonology  Appointments: [place] - [phone]  Rheumatology  Appointments: [place] - [phone]        [place] Location: [facility]  [address] [place], [place]  [address]  [place] Location: [facility] [address] [place], [place]  [address]  [place] Location: [facility] [address] [place], [place]  [address]    

## 2022-03-25 ENCOUNTER — Encounter
Admission: RE | Admit: 2022-03-25 | Discharge: 2022-03-25 | Disposition: A | Discharge status: Home / Self Care | Payer: Medicare PPO | Source: Ambulatory Visit | Attending: Orthopedic Surgery | Admitting: Orthopedic Surgery

## 2022-03-25 VITALS — HR 84 | Temp 97.7°F | Resp 16 | Ht 71.0 in | Wt 230.2 lb

## 2022-03-25 DIAGNOSIS — I10 Essential (primary) hypertension: Secondary | ICD-10-CM | POA: Insufficient documentation

## 2022-03-25 DIAGNOSIS — M1712 Unilateral primary osteoarthritis, left knee: Secondary | ICD-10-CM | POA: Diagnosis not present

## 2022-03-25 DIAGNOSIS — Z01818 Encounter for other preprocedural examination: Secondary | ICD-10-CM | POA: Insufficient documentation

## 2022-03-25 DIAGNOSIS — Z01812 Encounter for preprocedural laboratory examination: Secondary | ICD-10-CM

## 2022-03-25 HISTORY — DX: Personal history of urinary calculi: Z87.442

## 2022-03-25 LAB — URINALYSIS, ROUTINE W REFLEX MICROSCOPIC
Bilirubin Urine: NEGATIVE
Glucose, UA: NEGATIVE mg/dL
Hgb urine dipstick: NEGATIVE
Ketones, ur: NEGATIVE mg/dL
Leukocytes,Ua: NEGATIVE
Nitrite: NEGATIVE
Protein, ur: NEGATIVE mg/dL
Specific Gravity, Urine: 1.019 (ref 1.005–1.030)
pH: 6 (ref 5.0–8.0)

## 2022-03-25 LAB — COMPREHENSIVE METABOLIC PANEL
ALT: 30 U/L (ref 0–44)
AST: 27 U/L (ref 15–41)
Albumin: 4.8 g/dL (ref 3.5–5.0)
Alkaline Phosphatase: 52 U/L (ref 38–126)
Anion gap: 11 (ref 5–15)
BUN: 13 mg/dL (ref 8–23)
CO2: 26 mmol/L (ref 22–32)
Calcium: 9.5 mg/dL (ref 8.9–10.3)
Chloride: 102 mmol/L (ref 98–111)
Creatinine, Ser: 0.97 mg/dL (ref 0.61–1.24)
GFR, Estimated: >60 mL/min (ref >60)
Glucose, Bld: 114 mg/dL — ABNORMAL HIGH (ref 70–99)
Potassium: 3.6 mmol/L (ref 3.5–5.1)
Sodium: 139 mmol/L (ref 135–145)
Total Bilirubin: 2 mg/dL — ABNORMAL HIGH (ref 0.3–1.2)
Total Protein: 7.4 g/dL (ref 6.5–8.1)

## 2022-03-25 LAB — CBC
HCT: 43.6 % (ref 39.0–52.0)
Hemoglobin: 15 g/dL (ref 13.0–17.0)
MCH: 29.1 pg (ref 26.0–34.0)
MCHC: 34.4 g/dL (ref 30.0–36.0)
MCV: 84.5 fL (ref 80.0–100.0)
Platelets: 227 10*3/uL (ref 150–400)
RBC: 5.16 MIL/uL (ref 4.22–5.81)
RDW: 13 % (ref 11.5–15.5)
WBC: 6.5 10*3/uL (ref 4.0–10.5)
nRBC: 0 % (ref 0.0–0.2)

## 2022-03-25 LAB — SURGICAL PCR SCREEN
MRSA, PCR: NEGATIVE
Staphylococcus aureus: NEGATIVE

## 2022-03-25 LAB — TYPE AND SCREEN
ABO/RH(D): A NEG
Antibody Screen: NEGATIVE

## 2022-03-25 LAB — C-REACTIVE PROTEIN: CRP: 0.5 mg/dL (ref <1.0)

## 2022-03-25 LAB — SEDIMENTATION RATE: Sed Rate: 3 mm/hr (ref 0–20)

## 2022-03-25 NOTE — Patient Instructions (Addendum)
Your procedure is scheduled on: Friday April 05, 2022. Report to Day Surgery inside [facility] 2nd floor, stop by admissions desk before getting on elevator.  To find out your arrival time please call [phone] between 1PM - 3PM on Thursday April 04, 2022.  Remember: Instructions that are not followed completely may result in serious medical risk,  up to and including death, or upon the discretion of your surgeon and anesthesiologist your  surgery may need to be rescheduled.     _X__ 1. Do not eat food after midnight the night before your procedure.                 No chewing gum or hard candies. You may drink clear liquids up to 2 hours                 before you are scheduled to arrive for your surgery- DO not drink clear                 liquids within 2 hours of the start of your surgery.                 Clear Liquids include:  water, apple juice without pulp, clear Gatorade, G2 or                  Gatorade Zero (avoid Red/Purple/Blue), Black Coffee or Tea (Do not add                 anything to coffee or tea).  __X__2.   Complete the "Ensure Clear Pre-surgery Clear Carbohydrate Drink" provided to you, 2 hours before arrival. **If you are diabetic you will be provided with an alternative drink, Gatorade Zero or G2.  __X__3.  On the morning of surgery brush your teeth with toothpaste and water, you                may rinse your mouth with mouthwash if you wish.  Do not swallow any toothpaste of mouthwash.     _X__ 4.  No Alcohol for 24 hours before or after surgery.   _X__ 5.  Do Not Smoke or use e-cigarettes For 24 Hours Prior to Your Surgery.                 Do not use any chewable tobacco products for at least 6 hours prior to                 Surgery.  _X__  6.  Do not use any recreational drugs (marijuana, cocaine, heroin, ecstasy, MDMA or other)                For at least one week prior to your surgery.  Combination of these drugs with anesthesia                 May have life threatening results.  ____  7.  Bring all medications with you on the day of surgery if instructed.   __X__8.  Notify your doctor if there is any change in your medical condition      (cold, fever, infections).     Do not wear jewelry, make-up, hairpins, clips or nail polish. Do not wear lotions, powders, or perfumes. You may wear deodorant. Do not shave 48 hours prior to surgery. Men may shave face and neck. Do not bring valuables to the hospital.    [facility] is not responsible for any belongings or valuables.  Contacts,  dentures or bridgework may not be worn into surgery. Leave your suitcase in the car. After surgery it may be brought to your room. For patients admitted to the hospital, discharge time is determined by your treatment team.   Patients discharged the day of surgery will not be allowed to drive home.   Make arrangements for someone to be with you for the first 24 hours of your Same Day Discharge.   __X__ Take these medicines the morning of surgery with A SIP OF WATER:    1. finasteride (PROSCAR) 5 MG tablet  2. amsulosin (FLOMAX) 0.4 MG CAPS capsule  3.   4.  5.  6.  ____ Fleet Enema (as directed)   __X__ Use CHG Soap (or wipes) as directed  ____ Use Benzoyl Peroxide Gel as instructed  ____ Use inhalers on the day of surgery  ____ Stop metformin 2 days prior to surgery    ____ Take 1/2 of usual insulin dose the night before surgery. No insulin the morning          of surgery.   ____ Call your PCP, cardiologist, or Pulmonologist if taking Coumadin/Plavix/aspirin and ask when to stop before your surgery.   __X__ One Week prior to surgery- Stop Anti-inflammatories such as Ibuprofen, Aleve, Advil, Motrin, meloxicam (MOBIC), diclofenac, etodolac, ketorolac, Toradol, Daypro, piroxicam, Goody's or BC powders. OK TO USE TYLENOL IF NEEDED   __X__ Stop supplements until after surgery.    ____ Bring C-Pap to the hospital.    If  you have any questions regarding your pre-procedure instructions,  Please call Pre-admit Testing at [phone]

## 2022-03-31 NOTE — H&P (Signed)
ORTHOPAEDIC HISTORY & PHYSICAL Britney Newstrom, Florinda Marker., MD - 03/15/2022 2:30 PM EDT Formatting of this note is different from the original. Images from the original note were not included. Chief Complaint: Chief Complaint  Patient presents with  Left knee degenerative arthrosis   Reason for Visit The patient is a 71 y.o. male who presents today for reevaluation of his left knee. He reports a history of left knee pain that has increased in severity over the last several months. He localizes most of the pain along the lateral aspect of the knee. He reports no swelling, no locking, and no giving way of the knee. The pain is aggravated by walking/hiking and prolonged cycling. The knee pain limits the patient's ability to hike long distances. He has tried to remain active with cycling. The patient has not appreciated any significant improvement despite NSAIDs and activity modification. He is not using any ambulatory aids. The patient states that the knee pain has progressed to the point that it is significantly interfering with his activities of daily living.  Medications: Current Outpatient Medications  Medication Sig Dispense Refill  amLODIPine (NORVASC) 10 MG tablet Take 1 tablet (10 mg total) by mouth once daily Needs to contact the office to schedule a physical before any other refills. 90 tablet 0  finasteride (PROSCAR) 5 mg tablet Take 5 mg by mouth once daily  fluticasone propionate (FLONASE) 50 mcg/actuation nasal spray TAKE 2 SPRAYS INTO BOTH NOSTRILS DAILY. NEEDS APPT 16 g 5  meloxicam (MOBIC) 15 MG tablet Take 1 tablet (15 mg total) by mouth once daily as needed 30 tablet 0  sildenafiL (VIAGRA) 50 MG tablet TAKE ONE TABLET BY MOUTH EVERY DAY AS NEEDED FOR EREILE DYSFUNCTION 24 tablet 0  simvastatin (ZOCOR) 40 MG tablet Take 1 tablet (40 mg total) by mouth at bedtime Needs to contact the office to schedule a physical before any other refills. 90 tablet 0  tamsulosin (FLOMAX) 0.4 mg  capsule Take 0.4 mg by mouth once daily  telmisartan-hydroCHLOROthiazide (MICARDIS HCT) 80-12.5 mg tablet TAKE 1 TABLET BY MOUTH DAILY 90 tablet 1   No current facility-administered medications for this visit.   Allergies: No Known Allergies  Past Medical History: Past Medical History:  Diagnosis Date  Hyperlipidemia  Hypertension   Past Surgical History: Past Surgical History:  Procedure Laterality Date  Right total hip arthroplasty 01/18/2019  Dr Marry Guan  ORAL SURGERY OPERATIVE NOTE 02/23/2019  Abscessed tooth  TONSILLECTOMY  T&A   Social History: Social History   Socioeconomic History  Marital status: Married  Spouse name: Candance  Number of children: 1  Years of education: 18  Highest education level: Bachelor's degree (e.g., BA, AB, BS)  Occupational History  Occupation: Semi-retired- Statistician  Tobacco Use  Smoking status: Never  Smokeless tobacco: Never  Vaping Use  Vaping Use: Never used  Substance and Sexual Activity  Alcohol use: Not Currently  Alcohol/week: 6.0 standard drinks  Types: 6 Standard drinks or equivalent per week  Comment: 6 times a week  Drug use: Never  Sexual activity: Yes  Partners: Female   Family History: Family History  Problem Relation Age of Onset  Heart disease Mother  Melanoma Mother   Review of Systems: A comprehensive 14 point ROS was performed, reviewed, and the pertinent orthopaedic findings are documented in the HPI.  Exam BP (!) 142/92  Ht 177.8 cm ('5\' 10"'$ )  Wt (!) 103.7 kg (228 lb 9.6 oz)  BMI 32.80 kg/m   General:  Well-developed,  well-nourished male seen in no acute distress.  Antalgic gait mat startup. Valgus thrust to the left knee.  HEENT:  Atraumatic, normocephalic. Pupils are equal and reactive to light. Extraocular motion is intact. Sclera are clear. Oropharynx is clear with moist mucosa.  Neck:  Supple, nontender, and with good ROM. No thyromegaly, adenopathy, JVD, or carotid  bruits.  Lungs:  Clear to auscultation bilaterally.  Cardiovascular:  Regular rate and rhythm. Normal S1, S2. No murmur. No appreciable gallops or rubs. Peripheral pulses are palpable. No lower extremity edema. Homan`s test is negative.  Abdomen:  Soft, nontender, nondistended. Bowel sounds are present.  Extremities: Good strength, stability, and range of motion of the upper extremities. Good range of motion of the hips and ankles.  Left Knee: Soft tissue swelling: none Effusion: none Erythema: none Crepitance: mild Tenderness: lateral Alignment: relative valgus Mediolateral laxity: lateral pseudolaxity Posterior sag: negative Patellar tracking: Good tracking without evidence of subluxation or tilt Atrophy: No significant atrophy.  Quadriceps tone was good. Range of motion: 0/10/128 degrees  Neurologic:  Awake, alert, and oriented.  Sensory function is intact to pinprick and light touch.  Motor strength is judged to be 5/5.  Motor coordination is within normal limits.  No apparent clonus. No tremor.   X-rays: I reviewed the leftk knee radiographs that were performed at [facility] on 02/07/2022. There is significant narrowing of the lateral cartilage space with bone-on-bone articulation and associated valgus alignment. Osteophyte formation is noted. Subchondral sclerosis is noted. No evidence of fracture or dislocation.  Impression: Degenerative arthrosis of the left knee  Plan:  The findings were discussed in detail with the patient. The patient was given informational material on total knee replacement. Conservative treatment options were reviewed with the patient. We discussed the risks and benefits of surgical intervention. The usual perioperative course was also discussed in detail. The patient expressed understanding of the risks and benefits of surgical intervention and would like to proceed with plans for left total knee arthroplasty.  I spent a total of 40  minutes in both face-to-face and non-face-to-face activities, excluding procedures performed, for this visit on the date of this encounter.  MEDICAL CLEARANCE: Per anesthesiology. ACTIVITY: As tolerated. WORK STATUS: Anticipate out of work for 6-8 weeks following surgery. THERAPY: Preoperative physical therapy evaluation. MEDICATIONS: Requested Prescriptions   No prescriptions requested or ordered in this encounter   FOLLOW-UP: Return for preop History & Physical pending surgery date.  Kylian Loh P. Holley Bouche., M.D.  This note was generated in part with voice recognition software and I apologize for any typographical errors that were not detected and corrected.  Electronically signed by Lamar Benes., MD at 03/17/2022 9:21 AM EDT

## 2022-04-02 ENCOUNTER — Other Ambulatory Visit: Payer: Self-pay | Admitting: Urology

## 2022-04-04 MED ORDER — CEFAZOLIN SODIUM-DEXTROSE 2-4 GM/100ML-% IV SOLN
2.0000 g | INTRAVENOUS | Status: AC
  Administered 2022-04-05: 2 g via INTRAVENOUS

## 2022-04-04 MED ORDER — CHLORHEXIDINE GLUCONATE 4 % EX LIQD: 60.0000 mL | Freq: Once | CUTANEOUS | Status: DC

## 2022-04-04 MED ORDER — FAMOTIDINE 20 MG PO TABS: 20.0000 mg | ORAL_TABLET | Freq: Once | ORAL | Status: AC

## 2022-04-04 MED ORDER — LACTATED RINGERS IV SOLN: INTRAVENOUS | Status: DC

## 2022-04-04 MED ORDER — DEXAMETHASONE SODIUM PHOSPHATE 10 MG/ML IJ SOLN: 8.0000 mg | Freq: Once | INTRAMUSCULAR | Status: AC

## 2022-04-04 MED ORDER — GABAPENTIN 300 MG PO CAPS: 300.0000 mg | ORAL_CAPSULE | Freq: Once | ORAL | Status: AC

## 2022-04-04 MED ORDER — ORAL CARE MOUTH RINSE: 15.0000 mL | Freq: Once | OROMUCOSAL | Status: AC

## 2022-04-04 MED ORDER — CHLORHEXIDINE GLUCONATE 0.12 % MT SOLN: 15.0000 mL | Freq: Once | OROMUCOSAL | Status: AC

## 2022-04-04 MED ORDER — CELECOXIB 200 MG PO CAPS: 400.0000 mg | ORAL_CAPSULE | Freq: Once | ORAL | Status: AC

## 2022-04-04 MED ORDER — TRANEXAMIC ACID-NACL 1000-0.7 MG/100ML-% IV SOLN
1000.0000 mg | INTRAVENOUS | Status: AC
  Administered 2022-04-05: 1000 mg via INTRAVENOUS

## 2022-04-05 ENCOUNTER — Ambulatory Visit: Payer: Medicare PPO | Admitting: Urgent Care

## 2022-04-05 ENCOUNTER — Other Ambulatory Visit: Payer: Self-pay

## 2022-04-05 ENCOUNTER — Observation Stay: Payer: Medicare PPO

## 2022-04-05 ENCOUNTER — Encounter: Payer: Self-pay | Admitting: Orthopedic Surgery

## 2022-04-05 ENCOUNTER — Observation Stay
Admission: RE | Admit: 2022-04-05 | Discharge: 2022-04-06 | Disposition: A | Discharge status: Home Health | Payer: Medicare PPO | Attending: Orthopedic Surgery | Admitting: Orthopedic Surgery

## 2022-04-05 ENCOUNTER — Encounter
Admission: RE | Disposition: A | Discharge status: Home Health | Payer: Self-pay | Source: Home / Self Care | Attending: Orthopedic Surgery

## 2022-04-05 ENCOUNTER — Ambulatory Visit: Payer: Medicare PPO | Admitting: Registered Nurse

## 2022-04-05 DIAGNOSIS — Z96641 Presence of right artificial hip joint: Secondary | ICD-10-CM | POA: Insufficient documentation

## 2022-04-05 DIAGNOSIS — Z79899 Other long term (current) drug therapy: Secondary | ICD-10-CM | POA: Insufficient documentation

## 2022-04-05 DIAGNOSIS — I10 Essential (primary) hypertension: Secondary | ICD-10-CM | POA: Insufficient documentation

## 2022-04-05 DIAGNOSIS — Z96659 Presence of unspecified artificial knee joint: Secondary | ICD-10-CM

## 2022-04-05 DIAGNOSIS — M1712 Unilateral primary osteoarthritis, left knee: Secondary | ICD-10-CM | POA: Diagnosis present

## 2022-04-05 HISTORY — PX: KNEE ARTHROPLASTY: SHX992

## 2022-04-05 SURGERY — ARTHROPLASTY, KNEE, TOTAL, USING IMAGELESS COMPUTER-ASSISTED NAVIGATION
Anesthesia: Spinal | Site: Knee | Laterality: Left

## 2022-04-05 MED ORDER — TRANEXAMIC ACID-NACL 1000-0.7 MG/100ML-% IV SOLN
INTRAVENOUS | Status: AC
  Filled 2022-04-05: qty 100

## 2022-04-05 MED ORDER — DEXAMETHASONE SODIUM PHOSPHATE 10 MG/ML IJ SOLN
INTRAMUSCULAR | Status: AC
  Administered 2022-04-05: 8 mg via INTRAVENOUS
  Filled 2022-04-05: qty 1

## 2022-04-05 MED ORDER — SODIUM CHLORIDE 0.9 % IR SOLN
Status: DC | PRN
  Administered 2022-04-05: 3000 mL

## 2022-04-05 MED ORDER — CELECOXIB 200 MG PO CAPS
200.0000 mg | ORAL_CAPSULE | Freq: Two times a day (BID) | ORAL | Status: DC
Start: 2022-04-05 — End: 2022-04-06
  Administered 2022-04-05 – 2022-04-06 (×2): 200 mg via ORAL
  Filled 2022-04-05 (×6): qty 1

## 2022-04-05 MED ORDER — PROPOFOL 1000 MG/100ML IV EMUL
INTRAVENOUS | Status: AC
  Filled 2022-04-05: qty 100

## 2022-04-05 MED ORDER — DIPHENHYDRAMINE HCL 12.5 MG/5ML PO ELIX
12.5000 mg | ORAL_SOLUTION | ORAL | Status: DC | PRN
  Administered 2022-04-05: 12.5 mg via ORAL
  Filled 2022-04-05: qty 5

## 2022-04-05 MED ORDER — SODIUM CHLORIDE (PF) 0.9 % IJ SOLN
INTRAMUSCULAR | Status: DC | PRN
  Administered 2022-04-05: 120 mL via INTRAMUSCULAR

## 2022-04-05 MED ORDER — ONDANSETRON HCL 4 MG/2ML IJ SOLN: 4.0000 mg | Freq: Four times a day (QID) | INTRAMUSCULAR | Status: DC | PRN

## 2022-04-05 MED ORDER — PROPOFOL 10 MG/ML IV BOLUS
INTRAVENOUS | Status: AC
  Filled 2022-04-05: qty 40

## 2022-04-05 MED ORDER — TELMISARTAN 40 MG PO TABS
80.0000 mg | ORAL_TABLET | Freq: Every day | ORAL | Status: DC
  Administered 2022-04-06: 80 mg via ORAL
  Filled 2022-04-05: qty 2

## 2022-04-05 MED ORDER — TRAMADOL HCL 50 MG PO TABS: 50.0000 mg | ORAL_TABLET | ORAL | Status: DC | PRN

## 2022-04-05 MED ORDER — PROPOFOL 10 MG/ML IV BOLUS
INTRAVENOUS | Status: DC | PRN
  Administered 2022-04-05: 40 mg via INTRAVENOUS
  Administered 2022-04-05: 10 mg via INTRAVENOUS

## 2022-04-05 MED ORDER — AMLODIPINE BESYLATE 10 MG PO TABS
10.0000 mg | ORAL_TABLET | Freq: Every day | ORAL | Status: DC
  Administered 2022-04-05: 10 mg via ORAL
  Filled 2022-04-05: qty 1

## 2022-04-05 MED ORDER — METOCLOPRAMIDE HCL 5 MG PO TABS
10.0000 mg | ORAL_TABLET | Freq: Three times a day (TID) | ORAL | Status: DC
  Administered 2022-04-05 – 2022-04-06 (×2): 10 mg via ORAL
  Filled 2022-04-05 (×3): qty 2

## 2022-04-05 MED ORDER — SIMVASTATIN 40 MG PO TABS
40.0000 mg | ORAL_TABLET | Freq: Every evening | ORAL | Status: DC
  Administered 2022-04-05: 40 mg via ORAL
  Filled 2022-04-05 (×3): qty 1

## 2022-04-05 MED ORDER — ONDANSETRON HCL 4 MG/2ML IJ SOLN
INTRAMUSCULAR | Status: DC | PRN
  Administered 2022-04-05: 4 mg via INTRAVENOUS

## 2022-04-05 MED ORDER — FLEET ENEMA 7-19 GM/118ML RE ENEM: 1.0000 | ENEMA | Freq: Once | RECTAL | Status: DC | PRN

## 2022-04-05 MED ORDER — MIDAZOLAM HCL 2 MG/2ML IJ SOLN
INTRAMUSCULAR | Status: AC
  Filled 2022-04-05: qty 2

## 2022-04-05 MED ORDER — PHENOL 1.4 % MT LIQD
1.0000 | OROMUCOSAL | Status: DC | PRN
Start: 2022-04-05 — End: 2022-04-06

## 2022-04-05 MED ORDER — FINASTERIDE 5 MG PO TABS
5.0000 mg | ORAL_TABLET | Freq: Every day | ORAL | Status: DC
  Administered 2022-04-06: 5 mg via ORAL
  Filled 2022-04-05: qty 1

## 2022-04-05 MED ORDER — CEFAZOLIN SODIUM-DEXTROSE 2-4 GM/100ML-% IV SOLN
INTRAVENOUS | Status: AC
  Filled 2022-04-05: qty 100

## 2022-04-05 MED ORDER — ENOXAPARIN SODIUM 30 MG/0.3ML IJ SOSY
30.0000 mg | PREFILLED_SYRINGE | Freq: Two times a day (BID) | INTRAMUSCULAR | Status: DC
  Administered 2022-04-06: 30 mg via SUBCUTANEOUS
  Filled 2022-04-05: qty 0.3

## 2022-04-05 MED ORDER — ENSURE PRE-SURGERY PO LIQD: 296.0000 mL | Freq: Once | ORAL | Status: DC

## 2022-04-05 MED ORDER — ALUM & MAG HYDROXIDE-SIMETH 200-200-20 MG/5ML PO SUSP: 30.0000 mL | ORAL | Status: DC | PRN

## 2022-04-05 MED ORDER — MIDAZOLAM HCL 5 MG/5ML IJ SOLN
INTRAMUSCULAR | Status: DC | PRN
  Administered 2022-04-05 (×2): 1 mg via INTRAVENOUS

## 2022-04-05 MED ORDER — CELECOXIB 200 MG PO CAPS
ORAL_CAPSULE | ORAL | Status: AC
  Administered 2022-04-05: 400 mg via ORAL
  Filled 2022-04-05: qty 2

## 2022-04-05 MED ORDER — ACETAMINOPHEN 10 MG/ML IV SOLN
INTRAVENOUS | Status: AC
  Filled 2022-04-05: qty 100

## 2022-04-05 MED ORDER — TRANEXAMIC ACID-NACL 1000-0.7 MG/100ML-% IV SOLN
1000.0000 mg | Freq: Once | INTRAVENOUS | Status: AC
  Administered 2022-04-05: 1000 mg via INTRAVENOUS

## 2022-04-05 MED ORDER — ACETAMINOPHEN 10 MG/ML IV SOLN
INTRAVENOUS | Status: DC | PRN
  Administered 2022-04-05: 1000 mg via INTRAVENOUS

## 2022-04-05 MED ORDER — FENTANYL CITRATE (PF) 100 MCG/2ML IJ SOLN
INTRAMUSCULAR | Status: DC | PRN
  Administered 2022-04-05: 50 ug via INTRAVENOUS
  Administered 2022-04-05 (×2): 25 ug via INTRAVENOUS

## 2022-04-05 MED ORDER — ONDANSETRON HCL 4 MG PO TABS: 4.0000 mg | ORAL_TABLET | Freq: Four times a day (QID) | ORAL | Status: DC | PRN

## 2022-04-05 MED ORDER — CEFAZOLIN SODIUM-DEXTROSE 2-4 GM/100ML-% IV SOLN
2.0000 g | Freq: Four times a day (QID) | INTRAVENOUS | Status: AC
  Administered 2022-04-05 – 2022-04-06 (×2): 2 g via INTRAVENOUS
  Filled 2022-04-05 (×4): qty 100

## 2022-04-05 MED ORDER — SURGIPHOR WOUND IRRIGATION SYSTEM - OPTIME
TOPICAL | Status: DC | PRN
  Administered 2022-04-05: 1

## 2022-04-05 MED ORDER — FERROUS SULFATE 325 (65 FE) MG PO TABS
325.0000 mg | ORAL_TABLET | Freq: Two times a day (BID) | ORAL | Status: DC
  Administered 2022-04-06: 325 mg via ORAL
  Filled 2022-04-05: qty 1

## 2022-04-05 MED ORDER — PANTOPRAZOLE SODIUM 40 MG PO TBEC
40.0000 mg | DELAYED_RELEASE_TABLET | Freq: Two times a day (BID) | ORAL | Status: DC
  Administered 2022-04-05 – 2022-04-06 (×2): 40 mg via ORAL
  Filled 2022-04-05 (×2): qty 1

## 2022-04-05 MED ORDER — PROPOFOL 500 MG/50ML IV EMUL
INTRAVENOUS | Status: DC | PRN
  Administered 2022-04-05: 100 ug/kg/min via INTRAVENOUS

## 2022-04-05 MED ORDER — PHENYLEPHRINE HCL-NACL 20-0.9 MG/250ML-% IV SOLN
INTRAVENOUS | Status: AC
  Filled 2022-04-05: qty 250

## 2022-04-05 MED ORDER — KETAMINE HCL 50 MG/5ML IJ SOSY
PREFILLED_SYRINGE | INTRAMUSCULAR | Status: AC
  Filled 2022-04-05: qty 5

## 2022-04-05 MED ORDER — FENTANYL CITRATE (PF) 100 MCG/2ML IJ SOLN: 25.0000 ug | INTRAMUSCULAR | Status: DC | PRN

## 2022-04-05 MED ORDER — FENTANYL CITRATE (PF) 100 MCG/2ML IJ SOLN
INTRAMUSCULAR | Status: AC
  Filled 2022-04-05: qty 2

## 2022-04-05 MED ORDER — ACETAMINOPHEN 10 MG/ML IV SOLN
1000.0000 mg | Freq: Four times a day (QID) | INTRAVENOUS | Status: DC
  Administered 2022-04-05 – 2022-04-06 (×2): 1000 mg via INTRAVENOUS
  Filled 2022-04-05 (×3): qty 100

## 2022-04-05 MED ORDER — OXYCODONE HCL 5 MG PO TABS
10.0000 mg | ORAL_TABLET | ORAL | Status: DC | PRN
  Administered 2022-04-06: 10 mg via ORAL
  Filled 2022-04-05: qty 2

## 2022-04-05 MED ORDER — PHENYLEPHRINE HCL-NACL 20-0.9 MG/250ML-% IV SOLN
INTRAVENOUS | Status: DC | PRN
  Administered 2022-04-05: 35 ug/min via INTRAVENOUS

## 2022-04-05 MED ORDER — TAMSULOSIN HCL 0.4 MG PO CAPS
0.4000 mg | ORAL_CAPSULE | Freq: Every day | ORAL | Status: DC
  Administered 2022-04-06: 0.4 mg via ORAL
  Filled 2022-04-05: qty 1

## 2022-04-05 MED ORDER — KETAMINE HCL 10 MG/ML IJ SOLN
INTRAMUSCULAR | Status: DC | PRN
  Administered 2022-04-05: 20 mg via INTRAVENOUS

## 2022-04-05 MED ORDER — SENNOSIDES-DOCUSATE SODIUM 8.6-50 MG PO TABS
1.0000 | ORAL_TABLET | Freq: Two times a day (BID) | ORAL | Status: DC
  Administered 2022-04-05 – 2022-04-06 (×2): 1 via ORAL
  Filled 2022-04-05 (×2): qty 1

## 2022-04-05 MED ORDER — OXYCODONE HCL 5 MG PO TABS
5.0000 mg | ORAL_TABLET | ORAL | Status: DC | PRN
  Administered 2022-04-05: 5 mg via ORAL
  Filled 2022-04-05 (×2): qty 1

## 2022-04-05 MED ORDER — GABAPENTIN 300 MG PO CAPS
ORAL_CAPSULE | ORAL | Status: AC
  Administered 2022-04-05: 300 mg via ORAL
  Filled 2022-04-05: qty 1

## 2022-04-05 MED ORDER — BUPIVACAINE HCL (PF) 0.5 % IJ SOLN
INTRAMUSCULAR | Status: DC | PRN
  Administered 2022-04-05: 3 mL via INTRATHECAL

## 2022-04-05 MED ORDER — 0.9 % SODIUM CHLORIDE (POUR BTL) OPTIME
TOPICAL | Status: DC | PRN
  Administered 2022-04-05: 500 mL

## 2022-04-05 MED ORDER — PHENYLEPHRINE 80 MCG/ML (10ML) SYRINGE FOR IV PUSH (FOR BLOOD PRESSURE SUPPORT)
PREFILLED_SYRINGE | INTRAVENOUS | Status: DC | PRN
  Administered 2022-04-05: 160 ug via INTRAVENOUS
  Administered 2022-04-05: 80 ug via INTRAVENOUS
  Administered 2022-04-05: 160 ug via INTRAVENOUS
  Administered 2022-04-05: 80 ug via INTRAVENOUS

## 2022-04-05 MED ORDER — FLUTICASONE PROPIONATE 50 MCG/ACT NA SUSP
1.0000 | Freq: Every day | NASAL | Status: DC | PRN
Start: 2022-04-05 — End: 2022-04-06

## 2022-04-05 MED ORDER — BISACODYL 10 MG RE SUPP: 10.0000 mg | Freq: Every day | RECTAL | Status: DC | PRN

## 2022-04-05 MED ORDER — ACETAMINOPHEN 325 MG PO TABS: 325.0000 mg | ORAL_TABLET | Freq: Four times a day (QID) | ORAL | Status: DC | PRN

## 2022-04-05 MED ORDER — PROPOFOL 1000 MG/100ML IV EMUL
INTRAVENOUS | Status: AC
Start: 2022-04-05 — End: ?
  Filled 2022-04-05: qty 100

## 2022-04-05 MED ORDER — SODIUM CHLORIDE 0.9 % IV SOLN: INTRAVENOUS | Status: DC

## 2022-04-05 MED ORDER — FAMOTIDINE 20 MG PO TABS
ORAL_TABLET | ORAL | Status: AC
  Administered 2022-04-05: 20 mg via ORAL
  Filled 2022-04-05: qty 1

## 2022-04-05 MED ORDER — CHLORHEXIDINE GLUCONATE 0.12 % MT SOLN
OROMUCOSAL | Status: AC
  Administered 2022-04-05: 15 mL via OROMUCOSAL
  Filled 2022-04-05: qty 15

## 2022-04-05 MED ORDER — MAGNESIUM HYDROXIDE 400 MG/5ML PO SUSP
30.0000 mL | Freq: Every day | ORAL | Status: DC
  Administered 2022-04-05 – 2022-04-06 (×2): 30 mL via ORAL
  Filled 2022-04-05 (×2): qty 30

## 2022-04-05 MED ORDER — ONDANSETRON HCL 4 MG/2ML IJ SOLN: 4.0000 mg | Freq: Once | INTRAMUSCULAR | Status: DC | PRN

## 2022-04-05 MED ORDER — HYDROCHLOROTHIAZIDE 12.5 MG PO TABS
12.5000 mg | ORAL_TABLET | Freq: Every day | ORAL | Status: DC
  Administered 2022-04-06: 12.5 mg via ORAL
  Filled 2022-04-05: qty 1

## 2022-04-05 MED ORDER — HYDROMORPHONE HCL 1 MG/ML IJ SOLN
0.5000 mg | INTRAMUSCULAR | Status: DC | PRN
  Filled 2022-04-05: qty 1

## 2022-04-05 MED ORDER — MENTHOL 3 MG MT LOZG: 1.0000 | LOZENGE | OROMUCOSAL | Status: DC | PRN

## 2022-04-05 MED ORDER — PHENYLEPHRINE 80 MCG/ML (10ML) SYRINGE FOR IV PUSH (FOR BLOOD PRESSURE SUPPORT)
PREFILLED_SYRINGE | INTRAVENOUS | Status: AC
  Filled 2022-04-05: qty 10

## 2022-04-05 MED ORDER — TELMISARTAN-HCTZ 80-12.5 MG PO TABS: 1.0000 | ORAL_TABLET | Freq: Every day | ORAL | Status: DC

## 2022-04-05 SURGICAL SUPPLY — 75 items
ATTUNE MED DOME PAT 41 KNEE (Knees) ×1 IMPLANT
ATTUNE PS FEM LT SZ 7 CEM KNEE (Femur) ×1 IMPLANT
ATTUNE PSRP INSR SZ7 5 KNEE (Insert) ×1 IMPLANT
BASE TIBIAL ROT PLAT SZ 8 KNEE (Knees) IMPLANT
BATTERY INSTRU NAVIGATION (MISCELLANEOUS) ×8 IMPLANT
BLADE SAW 70X12.5 (BLADE) ×2 IMPLANT
BLADE SAW 90X13X1.19 OSCILLAT (BLADE) ×2 IMPLANT
BLADE SAW 90X25X1.19 OSCILLAT (BLADE) ×2 IMPLANT
CEMENT HV SMART SET (Cement) ×2 IMPLANT
COOLER POLAR GLACIER W/PUMP (MISCELLANEOUS) ×2 IMPLANT
CUFF TOURN SGL QUICK 24 (TOURNIQUET CUFF)
CUFF TOURN SGL QUICK 34 (TOURNIQUET CUFF)
CUFF TRNQT CYL 24X4X16.5-23 (TOURNIQUET CUFF) IMPLANT
CUFF TRNQT CYL 34X4.125X (TOURNIQUET CUFF) IMPLANT
DRAPE 3/4 80X56 (DRAPES) ×2 IMPLANT
DRAPE INCISE IOBAN 66X45 STRL (DRAPES) IMPLANT
DRSG DERMACEA NONADH 3X8 (GAUZE/BANDAGES/DRESSINGS) ×2 IMPLANT
DRSG MEPILEX SACRM 8.7X9.8 (GAUZE/BANDAGES/DRESSINGS) ×2 IMPLANT
DRSG OPSITE POSTOP 4X14 (GAUZE/BANDAGES/DRESSINGS) ×2 IMPLANT
DRSG TEGADERM 4X4.75 (GAUZE/BANDAGES/DRESSINGS) ×2 IMPLANT
DURAPREP 26ML APPLICATOR (WOUND CARE) ×4 IMPLANT
ELECT CAUTERY BLADE 6.4 (BLADE) ×2 IMPLANT
ELECT REM PT RETURN 9FT ADLT (ELECTROSURGICAL) ×2
ELECTRODE REM PT RTRN 9FT ADLT (ELECTROSURGICAL) ×1 IMPLANT
EX-PIN ORTHOLOCK NAV 4X150 (PIN) ×4 IMPLANT
GLOVE BIO SURGEON STRL SZ7 (GLOVE) ×4 IMPLANT
GLOVE BIOGEL M STRL SZ7.5 (GLOVE) ×4 IMPLANT
GLOVE BIOGEL PI IND STRL 8 (GLOVE) ×1 IMPLANT
GLOVE BIOGEL PI INDICATOR 8 (GLOVE) ×1
GLOVE BIOGEL PI ORTHO PRO 7.5 (GLOVE) ×2
GLOVE PI ORTHO PRO STRL 7.5 (GLOVE) ×2 IMPLANT
GLOVE SURG UNDER POLY LF SZ7.5 (GLOVE) ×2 IMPLANT
GOWN STRL REUS W/ TWL LRG LVL3 (GOWN DISPOSABLE) ×2 IMPLANT
GOWN STRL REUS W/ TWL XL LVL3 (GOWN DISPOSABLE) ×1 IMPLANT
GOWN STRL REUS W/TWL LRG LVL3 (GOWN DISPOSABLE) ×2
GOWN STRL REUS W/TWL XL LVL3 (GOWN DISPOSABLE) ×1
HEMOVAC 400CC 10FR (MISCELLANEOUS) ×2 IMPLANT
HOLDER FOLEY CATH W/STRAP (MISCELLANEOUS) ×2 IMPLANT
HOLSTER ELECTROSUGICAL PENCIL (MISCELLANEOUS) ×2 IMPLANT
HOOD PEEL AWAY FLYTE STAYCOOL (MISCELLANEOUS) ×4 IMPLANT
IV NS IRRIG 3000ML ARTHROMATIC (IV SOLUTION) ×2 IMPLANT
KIT TURNOVER KIT A (KITS) ×2 IMPLANT
KNIFE SCULPS 14X20 (INSTRUMENTS) ×2 IMPLANT
MANIFOLD NEPTUNE II (INSTRUMENTS) ×4 IMPLANT
NDL SPNL 20GX3.5 QUINCKE YW (NEEDLE) ×2 IMPLANT
NEEDLE SPNL 20GX3.5 QUINCKE YW (NEEDLE) ×4 IMPLANT
NS IRRIG 500ML POUR BTL (IV SOLUTION) ×2 IMPLANT
PACK TOTAL KNEE (MISCELLANEOUS) ×2 IMPLANT
PAD ABD DERMACEA PRESS 5X9 (GAUZE/BANDAGES/DRESSINGS) ×4 IMPLANT
PAD WRAPON POLAR KNEE (MISCELLANEOUS) ×1 IMPLANT
PIN DRILL FIX HALF THREAD (BIT) ×4 IMPLANT
PIN FIXATION 1/8DIA X 3INL (PIN) ×2 IMPLANT
PULSAVAC PLUS IRRIG FAN TIP (DISPOSABLE) ×2
SOL PREP PVP 2OZ (MISCELLANEOUS) ×2
SOLUTION IRRIG SURGIPHOR (IV SOLUTION) ×2 IMPLANT
SOLUTION PREP PVP 2OZ (MISCELLANEOUS) ×1 IMPLANT
SPONGE DRAIN TRACH 4X4 STRL 2S (GAUZE/BANDAGES/DRESSINGS) ×2 IMPLANT
SPONGE T-LAP 18X18 ~~LOC~~+RFID (SPONGE) ×1 IMPLANT
STAPLER SKIN PROX 35W (STAPLE) ×2 IMPLANT
STOCKINETTE IMPERV 14X48 (MISCELLANEOUS) ×1 IMPLANT
STRAP TIBIA SHORT (MISCELLANEOUS) ×2 IMPLANT
SUCTION FRAZIER HANDLE 10FR (MISCELLANEOUS) ×1
SUCTION TUBE FRAZIER 10FR DISP (MISCELLANEOUS) ×1 IMPLANT
SUT VIC AB 0 CT1 36 (SUTURE) ×4 IMPLANT
SUT VIC AB 1 CT1 36 (SUTURE) ×4 IMPLANT
SUT VIC AB 2-0 CT2 27 (SUTURE) ×2 IMPLANT
SYR 30ML LL (SYRINGE) ×4 IMPLANT
TIBIAL BASE ROT PLAT SZ 8 KNEE (Knees) ×2 IMPLANT
TIP FAN IRRIG PULSAVAC PLUS (DISPOSABLE) ×1 IMPLANT
TOWEL OR 17X26 4PK STRL BLUE (TOWEL DISPOSABLE) ×1 IMPLANT
TOWER CARTRIDGE SMART MIX (DISPOSABLE) ×2 IMPLANT
TRAY FOLEY MTR SLVR 16FR STAT (SET/KITS/TRAYS/PACK) ×2 IMPLANT
WATER STERILE IRR 1000ML POUR (IV SOLUTION) ×1 IMPLANT
WATER STERILE IRR 500ML POUR (IV SOLUTION) ×1 IMPLANT
WRAPON POLAR PAD KNEE (MISCELLANEOUS) ×2

## 2022-04-05 NOTE — Anesthesia Procedure Notes (Signed)
Spinal  Patient location during procedure: OR Start time: 04/05/2022 2:22 PM End time: 04/05/2022 2:29 PM Reason for block: surgical anesthesia Staffing Performed: resident/CRNA  Anesthesiologist: Molli Barrows, MD Resident/CRNA: Loletha Grayer, CRNA Performed by: Loletha Grayer, CRNA Authorized by: Molli Barrows, MD   Preanesthetic Checklist Completed: patient identified, IV checked, site marked, risks and benefits discussed, surgical consent, monitors and equipment checked, pre-op evaluation and timeout performed Spinal Block Patient position: sitting Prep: Betadine Patient monitoring: heart rate, continuous pulse ox, blood pressure and cardiac monitor Approach: midline Location: L4-5 Injection technique: single-shot Needle Needle type: Introducer and Pencan  Needle gauge: 24 G Needle length: 9 cm Assessment Events: CSF return Additional Notes Negative paresthesia. Negative blood return. Positive free-flowing CSF. Expiration date of kit checked and confirmed. Patient tolerated procedure well, without complications.

## 2022-04-05 NOTE — Op Note (Signed)
OPERATIVE NOTE  DATE OF SURGERY:  04/05/2022  PATIENT NAME:  Arthur Jones   DOB: 04/17/1951  MRN: [id number]  PRE-OPERATIVE DIAGNOSIS: Degenerative arthrosis of the left knee, primary  POST-OPERATIVE DIAGNOSIS:  Same  PROCEDURE:  Left total knee arthroplasty using computer-assisted navigation  SURGEON:  Marciano Sequin. M.D.  ANESTHESIA: spinal  ESTIMATED BLOOD LOSS: 50 mL  FLUIDS REPLACED: 1400 mL of crystalloid  TOURNIQUET TIME: 108 minutes  DRAINS: 2 medium Hemovac drains  SOFT TISSUE RELEASES: Anterior cruciate ligament, posterior cruciate ligament, deep medial collateral ligament, patellofemoral ligament, and posterolateral corner  IMPLANTS UTILIZED: DePuy Attune size 7 posterior stabilized femoral component (cemented), size 8 rotating platform tibial component (cemented), 41 mm medialized dome patella (cemented), and a 5 mm stabilized rotating platform polyethylene insert.  INDICATIONS FOR SURGERY: Arthur Jones is a 71 y.o. year old male with a long history of progressive knee pain. X-rays demonstrated severe degenerative changes in tricompartmental fashion. The patient had not seen any significant improvement despite conservative nonsurgical intervention. After discussion of the risks and benefits of surgical intervention, the patient expressed understanding of the risks benefits and agree with plans for total knee arthroplasty.   The risks, benefits, and alternatives were discussed at length including but not limited to the risks of infection, bleeding, nerve injury, stiffness, blood clots, the need for revision surgery, cardiopulmonary complications, among others, and they were willing to proceed.  PROCEDURE IN DETAIL: The patient was brought into the operating room and, after adequate spinal anesthesia was achieved, a tourniquet was placed on the patient's upper thigh. The patient's knee and leg were cleaned and prepped with alcohol and DuraPrep and draped in the  usual sterile fashion. A "timeout" was performed as per usual protocol. The lower extremity was exsanguinated using an Esmarch, and the tourniquet was inflated to 300 mmHg. An anterior longitudinal incision was made followed by a standard mid vastus approach. The deep fibers of the medial collateral ligament were elevated in a subperiosteal fashion off of the medial flare of the tibia so as to maintain a continuous soft tissue sleeve. The patella was subluxed laterally and the patellofemoral ligament was incised. Inspection of the knee demonstrated severe degenerative changes with full-thickness loss of articular cartilage. Osteophytes were debrided using a rongeur. Anterior and posterior cruciate ligaments were excised. Two 4.0 mm Schanz pins were inserted in the femur and into the tibia for attachment of the array of trackers used for computer-assisted navigation. Hip center was identified using a circumduction technique. Distal landmarks were mapped using the computer. The distal femur and proximal tibia were mapped using the computer. The distal femoral cutting guide was positioned using computer-assisted navigation so as to achieve a 5 distal valgus cut. The femur was sized and it was felt that a size 7 femoral component was appropriate. A size 7 femoral cutting guide was positioned and the anterior cut was performed and verified using the computer. This was followed by completion of the posterior and chamfer cuts. Femoral cutting guide for the central box was then positioned in the center box cut was performed.  Attention was then directed to the proximal tibia. Medial and lateral menisci were excised. The extramedullary tibial cutting guide was positioned using computer-assisted navigation so as to achieve a 0 varus-valgus alignment and 3 posterior slope. The cut was performed and verified using the computer. The proximal tibia was sized and it was felt that a size 8 tibial tray was appropriate. Tibial  and femoral trials  were inserted followed by insertion of a 5 mm polyethylene insert. The knee was felt to be tight laterally.  The trial components were removed and the knee was brought into full extension and distracted using the Moreland retractors.  The posterolateral corner was carefully released using a combination of electrocautery and Metzenbaum scissors.  Trial components were reinserted followed by placement of a 5 mm polyethylene trial.  This allowed for excellent mediolateral soft tissue balancing both in flexion and in full extension. Finally, the patella was cut and prepared so as to accommodate a 41 mm medialized dome patella. A patella trial was placed and the knee was placed through a range of motion with excellent patellar tracking appreciated. The femoral trial was removed after debridement of posterior osteophytes. The central post-hole for the tibial component was reamed followed by insertion of a keel punch. Tibial trials were then removed. Cut surfaces of bone were irrigated with copious amounts of normal saline using pulsatile lavage and then suctioned dry. Polymethylmethacrylate cement was prepared in the usual fashion using a vacuum mixer. Cement was applied to the cut surface of the proximal tibia as well as along the undersurface of a size 8 rotating platform tibial component. Tibial component was positioned and impacted into place. Excess cement was removed using [employer] fast streamer. Cement was then applied to the cut surfaces of the femur as well as along the posterior flanges of the size 7 femoral component. The femoral component was positioned and impacted into place. Excess cement was removed using [employer] fast streamer. A 5 mm polyethylene trial was inserted and the knee was brought into full extension with steady axial compression applied. Finally, cement was applied to the backside of a 41 mm medialized dome patella and the patellar component was positioned and patellar clamp applied.  Excess cement was removed using [employer] fast streamer. After adequate curing of the cement, the tourniquet was deflated after a total tourniquet time of 108 minutes. Hemostasis was achieved using electrocautery. The knee was irrigated with copious amounts of normal saline using pulsatile lavage followed by 450 ml of Surgiphor and then suctioned dry. 20 mL of 1.3% Exparel and 60 mL of 0.25% Marcaine in 40 mL of normal saline was injected along the posterior capsule, medial and lateral gutters, and along the arthrotomy site. A 5 mm stabilized rotating platform polyethylene insert was inserted and the knee was placed through a range of motion with excellent mediolateral soft tissue balancing appreciated and excellent patellar tracking noted. 2 medium drains were placed in the wound bed and brought out through separate stab incisions. The medial parapatellar portion of the incision was reapproximated using interrupted sutures of #1 Vicryl. Subcutaneous tissue was approximated in layers using first #0 Vicryl followed #2-0 Vicryl. The skin was approximated with skin staples. A sterile dressing was applied.  The patient tolerated the procedure well and was transported to the recovery room in stable condition.    Klayten Jolliff P. Holley Bouche., M.D.

## 2022-04-05 NOTE — Anesthesia Preprocedure Evaluation (Signed)
Anesthesia Evaluation  Patient identified by MRN, date of birth, ID band Patient awake    Reviewed: Allergy & Precautions, H&P , NPO status , Patient's Chart, lab work & pertinent test results, reviewed documented beta blocker date and time   Airway Mallampati: II   Neck ROM: full    Dental  (+) Poor Dentition   Pulmonary neg pulmonary ROS,    Pulmonary exam normal        Cardiovascular Exercise Tolerance: Good hypertension, On Medications negative cardio ROS Normal cardiovascular exam Rhythm:regular Rate:Normal     Neuro/Psych  Neuromuscular disease negative psych ROS   GI/Hepatic negative GI ROS, Neg liver ROS,   Endo/Other  negative endocrine ROS  Renal/GU negative Renal ROS  negative genitourinary   Musculoskeletal   Abdominal   Peds  Hematology negative hematology ROS (+)   Anesthesia Other Findings Past Medical History: No date: Erectile dysfunction No date: High cholesterol No date: History of kidney stones No date: Hypertension No date: Urinary retention Past Surgical History: No date: COLONOSCOPY 01/11/2021: EXTRACORPOREAL SHOCK WAVE LITHOTRIPSY; Right     Comment:  Procedure: EXTRACORPOREAL SHOCK WAVE LITHOTRIPSY (ESWL);              Surgeon: Billey Co, MD;  Location: [facility] ORS;                Service: Urology;  Laterality: Right; No date: NO PAST SURGERIES No date: TONSILLECTOMY     Comment:  as a child 01/18/2019: TOTAL HIP ARTHROPLASTY; Right     Comment:  Procedure: TOTAL HIP ARTHROPLASTY - RIGHT;  Surgeon:               Dereck Leep, MD;  Location: [facility] ORS;  Service:               Orthopedics;  Laterality: Right; BMI    Body Mass Index: 31.80 kg/m     Reproductive/Obstetrics negative OB ROS                             Anesthesia Physical Anesthesia Plan  ASA: 3  Anesthesia Plan: Spinal   Post-op Pain Management:    Induction:   PONV Risk Score  and Plan: 2  Airway Management Planned:   Additional Equipment:   Intra-op Plan:   Post-operative Plan:   Informed Consent: I have reviewed the patients History and Physical, chart, labs and discussed the procedure including the risks, benefits and alternatives for the proposed anesthesia with the patient or authorized representative who has indicated his/her understanding and acceptance.     Dental Advisory Given  Plan Discussed with: CRNA  Anesthesia Plan Comments:         Anesthesia Quick Evaluation

## 2022-04-05 NOTE — Interval H&P Note (Signed)
History and Physical Interval Note:  04/05/2022 1:49 PM  Arthur Jones  has presented today for surgery, with the diagnosis of PRIMARY OSTEOARTHRITIS OF LEFT KNEE..  The various methods of treatment have been discussed with the patient and family. After consideration of risks, benefits and other options for treatment, the patient has consented to  Procedure(s): COMPUTER ASSISTED TOTAL KNEE ARTHROPLASTY - RNFA (Left) as a surgical intervention.  The patient's history has been reviewed, patient examined, no change in status, stable for surgery.  I have reviewed the patient's chart and labs.  Questions were answered to the patient's satisfaction.     [place]

## 2022-04-05 NOTE — TOC Progression Note (Signed)
Transition of Care [facility]) - Progression Note    Patient Details  Name: Arthur Jones MRN: [id number] Date of Birth: [birth year]  [facility]) CM/SW [place], RN Phone Number: 04/05/2022, 2:51 PM  Clinical Narrative:    The patient was accepted by [facility] for [facility] services prior to surgery He was ordered a 3 in 1 to be delivered to his home prior to surgery by [employer] He reported that he has a Rolling walker  And a raised toilet seat, he also reports having a shower seat His spouse [name] will provide transportation        Expected Discharge Plan and Services                                                 Social Determinants of Health (SDOH) Interventions    Readmission Risk Interventions     No data to display

## 2022-04-05 NOTE — Transfer of Care (Signed)
Immediate Anesthesia Transfer of Care Note  Patient: Arthur Jones  Procedure(s) Performed: COMPUTER ASSISTED TOTAL KNEE ARTHROPLASTY - RNFA (Left: Knee)  Patient Location: PACU  Anesthesia Type:Spinal  Level of Consciousness: drowsy and patient cooperative  Airway & Oxygen Therapy: Patient Spontanous Breathing and Patient connected to face mask oxygen  Post-op Assessment: Report given to RN and Post -op Vital signs reviewed and stable  Post vital signs: Reviewed and stable  Last Vitals:  Vitals Value Taken Time  BP 107/77 04/05/22 1813  Temp    Pulse 71 04/05/22 1813  Resp 18 04/05/22 1814  SpO2 97 % 04/05/22 1813  Vitals shown include unvalidated device data.  Last Pain:  Vitals:   04/05/22 1046  TempSrc: Temporal  PainSc: 0-No pain         Complications: No notable events documented.

## 2022-04-06 DIAGNOSIS — M1712 Unilateral primary osteoarthritis, left knee: Secondary | ICD-10-CM | POA: Diagnosis not present

## 2022-04-06 MED ORDER — ONDANSETRON HCL 4 MG PO TABS: 4.0000 mg | ORAL_TABLET | Freq: Four times a day (QID) | ORAL | 0 refills | Status: DC | PRN

## 2022-04-06 MED ORDER — CELECOXIB 200 MG PO CAPS: 200.0000 mg | ORAL_CAPSULE | Freq: Two times a day (BID) | ORAL | 0 refills | Status: DC

## 2022-04-06 MED ORDER — ACETAMINOPHEN 500 MG PO TABS
500.0000 mg | ORAL_TABLET | Freq: Four times a day (QID) | ORAL | 0 refills | Status: AC | PRN
Start: 2022-04-06 — End: ?

## 2022-04-06 MED ORDER — TRAMADOL HCL 50 MG PO TABS: 50.0000 mg | ORAL_TABLET | ORAL | 0 refills | Status: DC | PRN

## 2022-04-06 MED ORDER — ENOXAPARIN SODIUM 40 MG/0.4ML IJ SOSY: 40.0000 mg | PREFILLED_SYRINGE | INTRAMUSCULAR | 0 refills | Status: DC

## 2022-04-06 MED ORDER — OXYCODONE HCL 5 MG PO TABS
5.0000 mg | ORAL_TABLET | ORAL | 0 refills | Status: DC | PRN
Start: 2022-04-06 — End: 2023-07-24

## 2022-04-06 NOTE — Plan of Care (Signed)

## 2022-04-06 NOTE — Progress Notes (Signed)
  Subjective: 1 Day Post-Op Procedure(s) (LRB): COMPUTER ASSISTED TOTAL KNEE ARTHROPLASTY - RNFA (Left) Patient reports pain as mild.   Patient is well, and has had no acute complaints or problems Plan is to go Home after hospital stay. Negative for chest pain and shortness of breath Fever: no Gastrointestinal:Negative for nausea and vomiting Patient has already had a BM since surgery.  Objective: Vital signs in last 24 hours: Temp:  [97 F (36.1 C)-98.2 F (36.8 C)] 97.9 F (36.6 C) (08/05 0726) Pulse Rate:  [71-87] 76 (08/05 0726) Resp:  [13-22] 16 (08/05 0726) BP: (106-171)/(76-96) 141/78 (08/05 0726) SpO2:  [92 %-98 %] 98 % (08/05 0726) Weight:  [103.4 kg] 103.4 kg (08/04 1046)  Intake/Output from previous day:  Intake/Output Summary (Last 24 hours) at 04/06/2022 0922 Last data filed at 04/06/2022 0535 Gross per 24 hour  Intake 2380 ml  Output 1100 ml  Net 1280 ml    Intake/Output this shift: No intake/output data recorded.  Labs: No results for input(s): "HGB" in the last 72 hours. No results for input(s): "WBC", "RBC", "HCT", "PLT" in the last 72 hours. No results for input(s): "NA", "K", "CL", "CO2", "BUN", "CREATININE", "GLUCOSE", "CALCIUM" in the last 72 hours. No results for input(s): "LABPT", "INR" in the last 72 hours.   EXAM General - Patient is Alert, Appropriate, and Oriented Extremity - ABD soft Neurovascular intact Dorsiflexion/Plantar flexion intact Bulky dressing removed this morning.  Honeycomb intact without any drainage. Hemovac with moderate bloody drainage, removed this morning without complications. Dressing/Incision - clean, dry, no drainage Motor Function - intact, moving foot and toes well on exam.  Abdomen soft with intact bowel sounds.  Past Medical History:  Diagnosis Date   Erectile dysfunction    High cholesterol    History of kidney stones    Hypertension    Urinary retention     Assessment/Plan: 1 Day Post-Op Procedure(s)  (LRB): COMPUTER ASSISTED TOTAL KNEE ARTHROPLASTY - RNFA (Left) Principal Problem:   Total knee replacement status  Estimated body mass index is 31.8 kg/m as calculated from the following:   Height as of this encounter: '5\' 11"'$  (1.803 m).   Weight as of this encounter: 103.4 kg. Advance diet Up with therapy D/C IV fluids when tolerating po intake.  Vitals reviewed this AM. Bulky dressing removed, hemovac removed without complication. Up with therapy today. Patient has had a BM. Plan for discharge home this afternoon pending progress with PT, OT.  DVT Prophylaxis - Lovenox, TED hose, and SCDs Weight-Bearing as tolerated to left leg  J. Cameron Proud, PA-C [facility] Orthopaedic Surgery 04/06/2022, 9:22 AM

## 2022-04-06 NOTE — Progress Notes (Signed)
Physical Therapy Treatment Patient Details Name: Arthur Jones MRN: [id number] DOB: [birth year] Today's Date: 04/06/2022   History of Present Illness Pt is a 71 year old male s/p L TKA 04/05/22; PMH significant for Hyperlipidemia, Hypertension, R THA 01/2019.    PT Comments    Pt still up in chair, reports doing well, has seen OT and is preparing for DC. Pt has reviewed HEP handout and is ready to go over with author. Generally good form noted, pain not a limiting factor for patient. Encouraged full recliner or supine for many given his baseline hamstrings shortness which limited TKE in upright sitting more than true joint effusion limitations. Pt left up in chair at EOS, polar care in place, heel elevated, MD in room.   Recommendations for follow up therapy are one component of a multi-disciplinary discharge planning process, led by the attending physician.  Recommendations may be updated based on patient status, additional functional criteria and insurance authorization.  Follow Up Recommendations  Follow physician's recommendations for discharge plan and follow up therapies     Assistance Recommended at Discharge Set up Supervision/Assistance  Patient can return home with the following A little help with bathing/dressing/bathroom;Assistance with cooking/housework;Assist for transportation   Equipment Recommendations  None recommended by PT    Recommendations for Other Services       Precautions / Restrictions Precautions Precautions: Knee;Fall Precaution Booklet Issued: Yes (comment) Restrictions Weight Bearing Restrictions: Yes LLE Weight Bearing: Weight bearing as tolerated     Mobility  Bed Mobility               General bed mobility comments: NT pt in recliner pre/post session    Transfers Overall transfer level: Needs assistance Equipment used: Rolling walker (2 wheels) Transfers: Sit to/from Stand Sit to Stand: Supervision                 Ambulation/Gait   Gait Distance (Feet): 180 Feet Assistive device: Rolling walker (2 wheels) Gait Pattern/deviations: WFL(Within Functional Limits)       General Gait Details: cues for 2-point gait   Stairs Stairs: Yes Stairs assistance: Supervision Stair Management: Two rails, Step to pattern Number of Stairs: 4     Wheelchair Mobility    Modified Rankin (Stroke Patients Only)       Balance                                            Cognition                                                Exercises Total Joint Exercises Ankle Circles/Pumps: AROM, Both, 5 reps, Seated Quad Sets: AROM, Left, 10 reps, Supine Short Arc Quad: AROM, 10 reps, Left, Supine Heel Slides: AAROM, AROM, Left, 10 reps, Supine Hip ABduction/ADduction: AROM, AAROM, Left, 10 reps, Supine Straight Leg Raises: AROM, Left, 5 reps, Supine Long Arc Quad: AROM, Left, 5 reps, Seated Knee Flexion: Seated, Strengthening, AROM, Left, 5 reps Goniometric ROM: 20-85 degrees Left knee flexion    General Comments        Pertinent Vitals/Pain      Home Living Family/patient expects to be discharged to:: Private residence Living Arrangements: Spouse/significant other Available Help at Discharge: Family Type of  Home: House Home Access: Stairs to enter   [employer] of Steps: 1   Home Layout: Two level;Able to live on main level with bedroom/bathroom Home Equipment: Shower seat - built Medical sales representative (2 wheels);BSC/3in1;Toilet riser      Prior Function            PT Goals (current goals can now be found in the care plan section) Acute Rehab PT Goals Patient Stated Goal: return to road cycling soon PT Goal Formulation: With patient Time For Goal Achievement: 04/20/22 Potential to Achieve Goals: Good Progress towards PT goals: Progressing toward goals    Frequency    BID      PT Plan Current plan remains appropriate     Co-evaluation              AM-PAC PT "6 Clicks" Mobility   Outcome Measure  Help needed turning from your back to your side while in a flat bed without using bedrails?: A Little Help needed moving from lying on your back to sitting on the side of a flat bed without using bedrails?: A Little Help needed moving to and from a bed to a chair (including a wheelchair)?: A Little Help needed standing up from a chair using your arms (e.g., wheelchair or bedside chair)?: A Little Help needed to walk in hospital room?: A Little Help needed climbing 3-5 steps with a railing? : A Little 6 Click Score: 18    End of Session Equipment Utilized During Treatment: Gait belt Activity Tolerance: Patient tolerated treatment well;No increased pain Patient left: in chair;with call bell/phone within reach;with nursing/sitter in room Nurse Communication: Mobility status PT Visit Diagnosis: Unsteadiness on feet (R26.81);Difficulty in walking, not elsewhere classified (R26.2)     Time: 1150-1209 PT Time Calculation (min) (ACUTE ONLY): 19 min  Charges:  $Gait Training: 8-22 mins $Therapeutic Exercise: 8-22 mins                    1:11 PM, 04/06/22 Etta Grandchild, PT, DPT Physical Therapist - [facility]  [phone] ([place])    Nazly Digilio C 04/06/2022, 1:09 PM

## 2022-04-06 NOTE — Discharge Summary (Signed)
Physician Discharge Summary  Patient ID: Arthur Jones MRN: [id number] DOB/AGE: 1951-04-08 71 y.o.  Admit date: 04/05/2022 Discharge date: 04/06/2022  Admission Diagnoses:  Total knee replacement status [Z96.659] Primary degenerative arthrosis of the left knee  Discharge Diagnoses: Patient Active Problem List   Diagnosis Date Noted   Total knee replacement status 04/05/2022   H/O total hip arthroplasty 01/18/2019   Low back pain with sciatica 10/08/2018   Primary osteoarthritis of right hip 10/08/2018   Erectile dysfunction    Benign essential hypertension 02/25/2014   Degenerative arthritis of left knee 02/17/2014   Degenerative arthritis of right knee 02/17/2014    Past Medical History:  Diagnosis Date   Erectile dysfunction    High cholesterol    History of kidney stones    Hypertension    Urinary retention    Transfusion: None.   Consultants (if any):   Discharged Condition: Improved  Hospital Course: Arthur Jones is an 71 y.o. male who was admitted 04/05/2022 with a diagnosis of primary degenerative arthrosis of the left knee and went to the operating room on 04/05/2022 and underwent the above named procedures.    Surgeries: Procedure(s): COMPUTER ASSISTED TOTAL KNEE ARTHROPLASTY - RNFA on 04/05/2022 Patient tolerated the surgery well. Taken to PACU where she was stabilized and then transferred to the orthopedic floor.  Started on Lovenox '30mg'$  q 12 hrs. Foot pumps applied bilaterally at 80 mm. Heels elevated on bed with rolled towels. No evidence of DVT. Negative Homan. Physical therapy started on day #1 for gait training and transfer. OT started day #1 for ADL and assisted devices.  Patient's IV and hemovac were both removed on POD1.  Foley was removed shortly after surgery.  Implants: DePuy Attune size 7 posterior stabilized femoral component (cemented), size 8 rotating platform tibial component (cemented), 41 mm medialized dome patella (cemented), and a 5 mm  stabilized rotating platform polyethylene insert.  He was given perioperative antibiotics:  Anti-infectives (From admission, onward)    Start     Dose/Rate Route Frequency Ordered Stop   04/05/22 2030  ceFAZolin (ANCEF) IVPB 2g/100 mL premix        2 g 200 mL/hr over 30 Minutes Intravenous Every 6 hours 04/05/22 1925 04/06/22 0251   04/05/22 1028  ceFAZolin (ANCEF) 2-4 GM/100ML-% IVPB       Note to Pharmacy: Olena Mater F: cabinet override      04/05/22 1028 04/05/22 1452   04/05/22 0600  ceFAZolin (ANCEF) IVPB 2g/100 mL premix        2 g 200 mL/hr over 30 Minutes Intravenous On call to O.R. 04/04/22 2236 04/05/22 1450     .  He was given sequential compression devices, early ambulation, and Lovenox for DVT prophylaxis.  He benefited maximally from the hospital stay and there were no complications.    Recent vital signs:  Vitals:   04/06/22 0535 04/06/22 0726  BP: (!) 156/87 (!) 141/78  Pulse: 85 76  Resp: 20 16  Temp: 97.9 F (36.6 C) 97.9 F (36.6 C)  SpO2: 97% 98%    Recent laboratory studies:  Lab Results  Component Value Date   HGB 15.0 03/25/2022   HGB 15.7 01/14/2019   HGB 16.1 07/09/2017   Lab Results  Component Value Date   WBC 6.5 03/25/2022   PLT 227 03/25/2022   Lab Results  Component Value Date   INR 0.9 01/14/2019   Lab Results  Component Value Date   NA 139 03/25/2022  K 3.6 03/25/2022   CL 102 03/25/2022   CO2 26 03/25/2022   BUN 13 03/25/2022   CREATININE 0.97 03/25/2022   GLUCOSE 114 (H) 03/25/2022   Discharge Medications:   Allergies as of 04/06/2022   No Known Allergies      Medication List     TAKE these medications    acetaminophen 500 MG tablet Commonly known as: TYLENOL Take 1-2 tablets (500-1,000 mg total) by mouth every 6 (six) hours as needed for mild pain (pain score 1-3 or temp > 100.5). What changed:  how much to take reasons to take this   amLODipine 10 MG tablet Commonly known as: NORVASC Take 10 mg by  mouth at bedtime.   amoxicillin 500 MG tablet Commonly known as: AMOXIL Take 500 mg by mouth 2 (two) times daily.   celecoxib 200 MG capsule Commonly known as: CELEBREX Take 1 capsule (200 mg total) by mouth 2 (two) times daily.   enoxaparin 40 MG/0.4ML injection Commonly known as: LOVENOX Inject 0.4 mLs (40 mg total) into the skin daily.   finasteride 5 MG tablet Commonly known as: PROSCAR TAKE 1 TABLET BY MOUTH DAILY   fluticasone 50 MCG/ACT nasal spray Commonly known as: FLONASE Place 1 spray into the nose daily as needed for allergies.   mupirocin ointment 2 % Commonly known as: BACTROBAN Apply to healing wounds QD until healed.   ondansetron 4 MG tablet Commonly known as: ZOFRAN Take 1 tablet (4 mg total) by mouth every 6 (six) hours as needed for nausea.   oxyCODONE 5 MG immediate release tablet Commonly known as: Oxy IR/ROXICODONE Take 1 tablet (5 mg total) by mouth every 4 (four) hours as needed for severe pain (pain score 4-6).   sildenafil 100 MG tablet Commonly known as: VIAGRA Take 1 tablet (100 mg total) by mouth daily as needed for erectile dysfunction. Take two hours prior to intercourse on an empty stomach   simvastatin 40 MG tablet Commonly known as: ZOCOR Take 40 mg by mouth every evening.   tamsulosin 0.4 MG Caps capsule Commonly known as: FLOMAX TAKE 1 CAPSULE BY MOUTH ONCE DAILY   telmisartan-hydrochlorothiazide 80-12.5 MG tablet Commonly known as: MICARDIS HCT   traMADol 50 MG tablet Commonly known as: ULTRAM Take 1-2 tablets (50-100 mg total) by mouth every 4 (four) hours as needed for moderate pain.   trolamine salicylate 10 % cream Commonly known as: ASPERCREME Apply 1 Application topically as needed for muscle pain.               Durable Medical Equipment  (From admission, onward)           Start     Ordered   04/05/22 1925  DME Walker rolling  Once       Question:  Patient needs a walker to treat with the following  condition  Answer:  Total knee replacement status   04/05/22 1925   04/05/22 1925  DME Bedside commode  Once       Question:  Patient needs a bedside commode to treat with the following condition  Answer:  Total knee replacement status   04/05/22 1925           Diagnostic Studies: DG Knee Left Port  Result Date: 04/05/2022 CLINICAL DATA:  Status post left knee replacement. EXAM: PORTABLE LEFT KNEE - 1-2 VIEW COMPARISON:  None Available. FINDINGS: A left knee replacement is seen without evidence surrounding lucency to suggest the presence of hardware loosening. No evidence  of an acute fracture or dislocation. A moderate sized joint effusion is seen with associated anterior soft tissue swelling. A mild amount of anterior soft tissue and joint space air is also noted. Anterior midline radiopaque skin staples are present. IMPRESSION: Left knee replacement without evidence of hardware loosening or fracture. Electronically Signed   By: Virgina Norfolk M.D.   On: 04/05/2022 18:33    Disposition: Plan for discharge home today pending progress with PT.   Follow-up Information     Fausto Skillern, PA-C Follow up on 04/19/2022.   Specialty: Orthopedic Surgery Why: at 8:45am Contact information: [place] [address] [phone]         Dereck Leep, MD Follow up on 05/21/2022.   Specialty: Orthopedic Surgery Why: at 2:45pm Contact information: [place] [address] [phone]                Signed: Judson Roch PA-C 04/06/2022, 9:30 AM

## 2022-04-06 NOTE — Evaluation (Signed)
Occupational Therapy Evaluation Patient Details Name: Arthur Jones MRN: [id number] DOB: 24-Mar-1951 Today's Date: 04/06/2022   History of Present Illness Pt is a 71 year old male s/p L TKA 04/05/22; PMH significant for Hyperlipidemia, Hypertension, R THA 01/2019   Clinical Impression   Chart reviewed, pt greeted in chair agreeable to OT evaluation. Pt is alert and oriented x4, PTA pt was MOD I-I in all Adl/IADL. Pt performs STS and functional mobility in room to bathroom with RW with supervision. Toilet transfer completed with supervision. LB dressing completed with supervision. Vcs provided throughout for technique. Pt reports he has a reacher to use at home PRN. Pt provided education re: safe ADL completion, DME use, falls prevention, home safety, polar care operation via hand out and demo. All questions answered appropriately. Pt is left in bedside chair, NAD, all needs met. OT will follow acutely. No OT follow up recommended following discharge.      Recommendations for follow up therapy are one component of a multi-disciplinary discharge planning process, led by the attending physician.  Recommendations may be updated based on patient status, additional functional criteria and insurance authorization.   Follow Up Recommendations  No OT follow up    Assistance Recommended at Discharge Intermittent Supervision/Assistance  Patient can return home with the following A little help with walking and/or transfers;A little help with bathing/dressing/bathroom;Assistance with cooking/housework    Functional Status Assessment  Patient has had a recent decline in their functional status and demonstrates the ability to make significant improvements in function in a reasonable and predictable amount of time.  Equipment Recommendations  None recommended by OT    Recommendations for Other Services       Precautions / Restrictions Precautions Precautions: Knee;Fall Restrictions Weight Bearing  Restrictions: Yes LLE Weight Bearing: Weight bearing as tolerated      Mobility Bed Mobility               General bed mobility comments: NT pt in recliner pre/post session    Transfers Overall transfer level: Needs assistance Equipment used: Rolling walker (2 wheels) Transfers: Sit to/from Stand Sit to Stand: Supervision                  Balance Overall balance assessment: Needs assistance   Sitting balance-Leahy Scale: Good     Standing balance support: During functional activity, Bilateral upper extremity supported Standing balance-Leahy Scale: Fair                             ADL either performed or assessed with clinical judgement   ADL Overall ADL's : Needs assistance/impaired Eating/Feeding: Set up;Sitting   Grooming: Wash/dry hands;Standing Grooming Details (indicate cue type and reason): sink level             Lower Body Dressing: Sit to/from stand;Supervision/safety   Toilet Transfer: Supervision/safety;Rolling walker (2 wheels);BSC/3in1;Ambulation   Toileting- Clothing Manipulation and Hygiene: Supervision/safety Toileting - Clothing Manipulation Details (indicate cue type and reason): anticipated     Functional mobility during ADLs: Supervision/safety;Rolling walker (2 wheels) (household distances in room)       Vision Baseline Vision/History: 1 Wears glasses Patient Visual Report: No change from baseline       Perception     Praxis      Pertinent Vitals/Pain Pain Assessment Pain Assessment: 0-10 Pain Score: 3  Pain Location: L knee Pain Descriptors / Indicators: Sore Pain Intervention(s): Limited activity within patient's tolerance, Monitored during  session, Ice applied, Repositioned     Hand Dominance Right   Extremity/Trunk Assessment Upper Extremity Assessment Upper Extremity Assessment: Overall WFL for tasks assessed   Lower Extremity Assessment Lower Extremity Assessment: LLE deficits/detail LLE  Deficits / Details: s/p L TKA       Communication Communication Communication: No difficulties   Cognition Arousal/Alertness: Awake/alert Behavior During Therapy: WFL for tasks assessed/performed Overall Cognitive Status: Within Functional Limits for tasks assessed                                       General Comments       Exercises Other Exercises Other Exercises: edu re: role of OT, role of rehab, discharge recommendations, home safety, DME use via demo and hand out   Shoulder Instructions      Home Living Family/patient expects to be discharged to:: Private residence Living Arrangements: Spouse/significant other Available Help at Discharge: Family Type of Home: House Home Access: Stairs to enter [employer] of Steps: 1   Home Layout: Two level;Able to live on main level with bedroom/bathroom               Home Equipment: Shower seat - built Medical sales representative (2 wheels);BSC/3in1;Toilet riser          Prior Functioning/Environment Prior Level of Function : Independent/Modified Independent             Mobility Comments: amb no AD ADLs Comments: MOD I- I in ADL/IADL, active        OT Problem List: Decreased strength;Decreased activity tolerance;Decreased knowledge of use of DME or AE      OT Treatment/Interventions: Self-care/ADL training;Patient/family education;Therapeutic exercise;DME and/or AE instruction;Energy conservation;Therapeutic activities    OT Goals(Current goals can be found in the care plan section) Acute Rehab OT Goals Patient Stated Goal: go home OT Goal Formulation: With patient Time For Goal Achievement: 04/20/22 Potential to Achieve Goals: Good ADL Goals Pt Will Perform Grooming: with modified independence;standing Pt Will Perform Lower Body Dressing: with modified independence;sit to/from stand Pt Will Transfer to Toilet: with modified independence;ambulating Pt Will Perform Toileting - Clothing  Manipulation and hygiene: with modified independence;sit to/from stand  OT Frequency: Min 2X/week    Co-evaluation              AM-PAC OT "6 Clicks" Daily Activity     Outcome Measure Help from another person eating meals?: None Help from another person taking care of personal grooming?: None Help from another person toileting, which includes using toliet, bedpan, or urinal?: None Help from another person bathing (including washing, rinsing, drying)?: A Little Help from another person to put on and taking off regular upper body clothing?: None Help from another person to put on and taking off regular lower body clothing?: A Little 6 Click Score: 22   End of Session Equipment Utilized During Treatment: Rolling walker (2 wheels) Nurse Communication: Mobility status  Activity Tolerance: Patient tolerated treatment well Patient left: in chair;with call bell/phone within reach  OT Visit Diagnosis: Unsteadiness on feet (R26.81)                Time: 1017-1030 OT Time Calculation (min): 13 min Charges:  OT General Charges $OT Visit: 1 Visit OT Evaluation $OT Eval Low Complexity: 1 Low  Shanon Payor, OTD OTR/L  04/06/22, 12:23 PM

## 2022-04-06 NOTE — Plan of Care (Signed)

## 2022-04-06 NOTE — Evaluation (Signed)
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  Clinical Impression  Pt in chair on entry, agreeable to session, but wants to return quickly for breakfast. Pt able to demonstrate transfers, AMB, and stairs performance with UE use at supervision level. Pt's pain is well controlled. Pt AMB with safe use of RW and good control of trunk and operative leg. ROM in Left knee 20-85 degrees this morning. Plan for return later in day for HEP education, handout left in room prior to exit.        Recommendations for follow up therapy are one component of a multi-disciplinary discharge planning process, led by the attending physician.  Recommendations may be updated based on patient status, additional functional criteria and insurance authorization.  Follow Up Recommendations Follow physician's recommendations for discharge plan and follow up therapies      Assistance Recommended at Discharge Set up Supervision/Assistance  Patient can return home with the following  A little help with bathing/dressing/bathroom;Assistance with cooking/housework;Assist for transportation    Equipment Recommendations None recommended by PT  Recommendations for Other Services       Functional Status Assessment Patient has had a recent decline in their functional status and demonstrates the ability to make significant improvements in function in a reasonable and predictable amount of time.     Precautions / Restrictions Precautions Precautions: Knee;Fall Precaution Booklet Issued: Yes (comment) Restrictions Weight Bearing Restrictions: Yes LLE Weight Bearing: Weight bearing as tolerated      Mobility  Bed Mobility               General bed mobility comments: NT pt in recliner pre/post session     Transfers Overall transfer level: Needs assistance Equipment used: Rolling walker (2 wheels) Transfers: Sit to/from Stand Sit to Stand: Supervision                Ambulation/Gait   Gait Distance (Feet): 180 Feet Assistive device: Rolling walker (2 wheels) Gait Pattern/deviations: WFL(Within Functional Limits)       General Gait Details: cues for 2-point gait  Stairs Stairs: Yes Stairs assistance: Supervision Stair Management: Two rails, Step to pattern Number of Stairs: 4    Wheelchair Mobility    Modified Rankin (Stroke Patients Only)       Balance                                             Pertinent Vitals/Pain      Home Living Family/patient expects to be discharged to:: Private residence Living Arrangements: Spouse/significant other Available Help at Discharge: Family Type of Home: House Home Access: Stairs to enter   Technical brewer of Steps: 1   Home Layout: Two level;Able to live on main level with bedroom/bathroom Home Equipment: Shower seat - built Medical sales representative (2 wheels);BSC/3in1;Toilet riser      Prior Function Prior Level of Function : Independent/Modified Independent             Mobility Comments: amb no AD ADLs Comments: MOD I- I in ADL/IADL, active     Hand Dominance   Dominant Hand: Right    Extremity/Trunk Assessment   Upper Extremity Assessment Upper Extremity Assessment: Overall WFL for  tasks assessed    Lower Extremity Assessment Lower Extremity Assessment: LLE deficits/detail LLE Deficits / Details: s/p L TKA       Communication   Communication: No difficulties  Cognition                                                General Comments      Exercises     Assessment/Plan    PT Assessment Patient needs continued PT services;All further PT needs can be met in the next venue of care  PT Problem List Decreased strength;Decreased range of  motion;Decreased activity tolerance;Decreased balance;Decreased mobility;Decreased coordination;Decreased knowledge of use of DME       PT Treatment Interventions DME instruction;Gait training;Stair training;Functional mobility training;Therapeutic activities;Therapeutic exercise;Patient/family education    PT Goals (Current goals can be found in the Care Plan section)  Acute Rehab PT Goals Patient Stated Goal: return to road cycling soon PT Goal Formulation: With patient Time For Goal Achievement: 04/20/22 Potential to Achieve Goals: Good    Frequency BID     Co-evaluation               AM-PAC PT "6 Clicks" Mobility  Outcome Measure Help needed turning from your back to your side while in a flat bed without using bedrails?: A Little Help needed moving from lying on your back to sitting on the side of a flat bed without using bedrails?: A Little Help needed moving to and from a bed to a chair (including a wheelchair)?: A Little Help needed standing up from a chair using your arms (e.g., wheelchair or bedside chair)?: A Little Help needed to walk in hospital room?: A Little Help needed climbing 3-5 steps with a railing? : A Little 6 Click Score: 18    End of Session Equipment Utilized During Treatment: Gait belt Activity Tolerance: Patient tolerated treatment well;No increased pain Patient left: in chair;with call bell/phone within reach;with nursing/sitter in room Nurse Communication: Mobility status PT Visit Diagnosis: Unsteadiness on feet (R26.81);Difficulty in walking, not elsewhere classified (R26.2)    Time: 4715-9539 PT Time Calculation (min) (ACUTE ONLY): 23 min   Charges:   PT Evaluation $PT Eval Low Complexity: 1 Low PT Treatments $Gait Training: 8-22 mins       1:04 PM, 04/06/22 Etta Grandchild, PT, DPT Physical Therapist - [facility]  [phone] ([place])     [place] C 04/06/2022, 1:02 PM

## 2022-04-06 NOTE — Progress Notes (Signed)
DISCHARGE NOTE:  Pt and wife given discharge instructions. They verbalized understanding. TED hose on both legs, extra honeycomb dressing sent. Pt wheeled to car by staff. Wife providing transportation.

## 2022-04-06 NOTE — Anesthesia Postprocedure Evaluation (Signed)
Anesthesia Post Note  Patient: Arthur Jones  Procedure(s) Performed: COMPUTER ASSISTED TOTAL KNEE ARTHROPLASTY - RNFA (Left: Knee)  Patient location during evaluation: Other Anesthesia Type: Spinal Level of consciousness: awake and alert Pain management: pain level controlled Respiratory status: spontaneous breathing and nonlabored ventilation Cardiovascular status: stable Postop Assessment: no apparent nausea or vomiting, spinal receding and no headache Anesthetic complications: no   No notable events documented.   Last Vitals:  Vitals:   04/05/22 1932 04/06/22 0008  BP: 129/88 (!) 151/86  Pulse: 77 87  Resp: 20 18  Temp: 36.6 C 36.8 C  SpO2: 96% 95%    Last Pain:  Vitals:   04/06/22 0305  TempSrc:   PainSc: [place]

## 2022-04-08 ENCOUNTER — Encounter: Payer: Self-pay | Admitting: Orthopedic Surgery

## 2022-04-22 ENCOUNTER — Other Ambulatory Visit: Payer: Self-pay | Admitting: Urology

## 2022-04-23 ENCOUNTER — Ambulatory Visit: Payer: Medicare PPO | Admitting: Dermatology

## 2022-04-23 DIAGNOSIS — B079 Viral wart, unspecified: Secondary | ICD-10-CM | POA: Diagnosis not present

## 2022-04-23 DIAGNOSIS — L821 Other seborrheic keratosis: Secondary | ICD-10-CM

## 2022-04-23 DIAGNOSIS — D485 Neoplasm of uncertain behavior of skin: Secondary | ICD-10-CM

## 2022-04-23 NOTE — Patient Instructions (Signed)
Wound Care Instructions  Cleanse wound gently with soap and water once a day then pat dry with clean gauze. Apply a thin coat of Petrolatum (petroleum jelly, "Vaseline") over the wound (unless you have an allergy to this). We recommend that you use a new, sterile tube of Vaseline. Do not pick or remove scabs. Do not remove the yellow or white "healing tissue" from the base of the wound.  Cover the wound with fresh, clean, nonstick gauze and secure with paper tape. You may use Band-Aids in place of gauze and tape if the wound is small enough, but would recommend trimming much of the tape off as there is often too much. Sometimes Band-Aids can irritate the skin.  You should call the office for your biopsy report after 1 week if you have not already been contacted.  If you experience any problems, such as abnormal amounts of bleeding, swelling, significant bruising, significant pain, or evidence of infection, please call the office immediately.  FOR ADULT SURGERY PATIENTS: If you need something for pain relief you may take 1 extra strength Tylenol (acetaminophen) AND 2 Ibuprofen (200mg each) together every 4 hours as needed for pain. (do not take these if you are allergic to them or if you have a reason you should not take them.) Typically, you may only need pain medication for 1 to 3 days.     Due to recent changes in healthcare laws, you may see results of your pathology and/or laboratory studies on MyChart before the doctors have had a chance to review them. We understand that in some cases there may be results that are confusing or concerning to you. Please understand that not all results are received at the same time and often the doctors may need to interpret multiple results in order to provide you with the best plan of care or course of treatment. Therefore, we ask that you please give us 2 business days to thoroughly review all your results before contacting the office for clarification. Should  we see a critical lab result, you will be contacted sooner.   If You Need Anything After Your Visit  If you have any questions or concerns for your doctor, please call our main line at [phone] and press option 4 to reach your doctor's medical assistant. If no one answers, please leave a voicemail as directed and we will return your call as soon as possible. Messages left after 4 pm will be answered the following business day.   You may also send us a message via MyChart. We typically respond to MyChart messages within 1-2 business days.  For prescription refills, please ask your pharmacy to contact our office. Our fax number is [phone].  If you have an urgent issue when the clinic is closed that cannot wait until the next business day, you can page your doctor at the number below.    Please note that while we do our best to be available for urgent issues outside of office hours, we are not available 24/7.   If you have an urgent issue and are unable to reach us, you may choose to seek medical care at your doctor's office, retail clinic, urgent care center, or emergency room.  If you have a medical emergency, please immediately call 911 or go to the emergency department.  Pager Numbers  - Dr. Kowalski: [phone]  - Dr. Moye: [phone]  - Dr. Stewart: [phone]  In the event of inclement weather, please call our main line at   [phone] for an update on the status of any delays or closures.  Dermatology Medication Tips: Please keep the boxes that topical medications come in in order to help keep track of the instructions about where and how to use these. Pharmacies typically print the medication instructions only on the boxes and not directly on the medication tubes.   If your medication is too expensive, please contact our office at [phone] option 4 or send us a message through MyChart.   We are unable to tell what your co-pay for medications will be in  advance as this is different depending on your insurance coverage. However, we may be able to find a substitute medication at lower cost or fill out paperwork to get insurance to cover a needed medication.   If a prior authorization is required to get your medication covered by your insurance company, please allow us 1-2 business days to complete this process.  Drug prices often vary depending on where the prescription is filled and some pharmacies may offer cheaper prices.  The website www.goodrx.com contains coupons for medications through different pharmacies. The prices here do not account for what the cost may be with help from insurance (it may be cheaper with your insurance), but the website can give you the price if you did not use any insurance.  - You can print the associated coupon and take it with your prescription to the pharmacy.  - You may also stop by our office during regular business hours and pick up a GoodRx coupon card.  - If you need your prescription sent electronically to a different pharmacy, notify our office through  MyChart or by phone at [phone] option 4.     Si Usted Necesita Algo Despus de Su Visita  Tambin puede enviarnos un mensaje a travs de MyChart. Por lo general respondemos a los mensajes de MyChart en el transcurso de 1 a 2 das hbiles.  Para renovar recetas, por favor pida a su farmacia que se ponga en contacto con nuestra oficina. Nuestro nmero de fax es el [phone].  Si tiene un asunto urgente cuando la clnica est cerrada y que no puede esperar hasta el siguiente da hbil, puede llamar/localizar a su doctor(a) al nmero que aparece a continuacin.   Por favor, tenga en cuenta que aunque hacemos todo lo posible para estar disponibles para asuntos urgentes fuera del horario de oficina, no estamos disponibles las 24 horas del da, los 7 das de la semana.   Si tiene un problema urgente y no puede comunicarse con nosotros, puede  optar por buscar atencin mdica  en el consultorio de su doctor(a), en una clnica privada, en un centro de atencin urgente o en una sala de emergencias.  Si tiene una emergencia mdica, por favor llame inmediatamente al 911 o vaya a la sala de emergencias.  Nmeros de bper  - Dr. Kowalski: [phone]  - Dra. Moye: [phone]  - Dra. Stewart: [phone]  En caso de inclemencias del tiempo, por favor llame a nuestra lnea principal al [phone] para una actualizacin sobre el estado de cualquier retraso o cierre.  Consejos para la medicacin en dermatologa: Por favor, guarde las cajas en las que vienen los medicamentos de uso tpico para ayudarle a seguir las instrucciones sobre dnde y cmo usarlos. Las farmacias generalmente imprimen las instrucciones del medicamento slo en las cajas y no directamente en los tubos del medicamento.   Si su medicamento es muy caro, por favor, pngase en contacto con   nuestra oficina llamando al [phone] y presione la opcin 4 o envenos un mensaje a travs de MyChart.   No podemos decirle cul ser su copago por los medicamentos por adelantado ya que esto es diferente dependiendo de la cobertura de su seguro. Sin embargo, es posible que podamos encontrar un medicamento sustituto a menor costo o llenar un formulario para que el seguro cubra el medicamento que se considera necesario.   Si se requiere una autorizacin previa para que su compaa de seguros cubra su medicamento, por favor permtanos de 1 a 2 das hbiles para completar este proceso.  Los precios de los medicamentos varan con frecuencia dependiendo del lugar de dnde se surte la receta y alguna farmacias pueden ofrecer precios ms baratos.  El sitio web www.goodrx.com tiene cupones para medicamentos de diferentes farmacias. Los precios aqu no tienen en cuenta lo que podra costar con la ayuda del seguro (puede ser ms barato con su seguro), pero el sitio web puede darle el  precio si no utiliz ningn seguro.  - Puede imprimir el cupn correspondiente y llevarlo con su receta a la farmacia.  - Tambin puede pasar por nuestra oficina durante el horario de atencin regular y recoger una tarjeta de cupones de GoodRx.  - Si necesita que su receta se enve electrnicamente a una farmacia diferente, informe a nuestra oficina a travs de MyChart de [place] o por telfono llamando al [phone] y presione la opcin 4.  

## 2022-04-23 NOTE — Progress Notes (Signed)
   Follow-Up Visit   Subjective  Arthur Jones is a 71 y.o. male who presents for the following: Skin Problem (Patient here today for a spot at left cheek, has gotten larger over the last 5-6 weeks. ). He also has a few other spots.    The following portions of the chart were reviewed this encounter and updated as appropriate:   Tobacco  Allergies  Meds  Problems  Med Hx  Surg Hx  Fam Hx      Review of Systems:  No other skin or systemic complaints except as noted in HPI or Assessment and Plan.  Objective  Well appearing patient in no apparent distress; mood and affect are within normal limits.  A focused examination was performed including face. Relevant physical exam findings are noted in the Assessment and Plan.  left medial cheek 0.2 cm pink papule with cutaneous horn R/o Verruca vs SCC       Assessment & Plan  Neoplasm of uncertain behavior of skin left medial cheek  Epidermal / dermal shaving  Lesion diameter (cm):  0.2 Informed consent: discussed and consent obtained   Timeout: patient name, date of birth, surgical site, and procedure verified   Patient was prepped and draped in usual sterile fashion: area prepped with isopropyl alcohol. Anesthesia: the lesion was anesthetized in a standard fashion   Anesthetic:  1% lidocaine w/ epinephrine 1-100,000 buffered w/ 8.4% NaHCO3 Instrument used: flexible razor blade   Hemostasis achieved with: aluminum chloride   Outcome: patient tolerated procedure well   Post-procedure details: wound care instructions given   Additional details:  Mupirocin and a bandage applied  Destruction of lesion Complexity: simple   Destruction method: cryotherapy   Informed consent: discussed and consent obtained   Timeout:  patient name, date of birth, surgical site, and procedure verified Lesion destroyed using liquid nitrogen: Yes   Region frozen until ice ball extended beyond lesion: Yes   Outcome: patient tolerated procedure  well with no complications   Post-procedure details: wound care instructions given    Specimen 1 - Surgical pathology Differential Diagnosis: R/o Verruca vs SCC  Check Margins: No 0.2 cm pink papule with cutaneous horn   Related Medications mupirocin ointment (BACTROBAN) 2 % Apply to healing wounds QD until healed.  Seborrheic Keratoses - Stuck-on, waxy, tan-brown papules and/or plaques  - Benign-appearing - Discussed benign etiology and prognosis. - Observe - Call for any changes   Return if symptoms worsen or fail to improve.  Graciella Belton, RMA, am acting as scribe for Forest Gleason, MD .   Documentation: I have reviewed the above documentation for accuracy and completeness, and I agree with the above.  Forest Gleason, MD

## 2022-04-24 ENCOUNTER — Telehealth: Payer: Self-pay

## 2022-04-24 NOTE — Telephone Encounter (Addendum)
Tried calling patient regarding results. No answer. Lmom for patient to call office.    ----- Message from Alfonso Patten, MD sent at 04/24/2022  4:54 PM EDT ----- Skin , left medial cheek VERRUCA VULGARIS, IRRITATED  This is a WART caused by the human papilloma virus. It is not dangerous but is contagious and can spread to other areas of skin or other people if it is not completely gone. No additional treatment is needed. However, if it comes back, we can freeze it in clinic with liquid nitrogen (a quick in office procedure) or you can also treat it at home with an over the counter salicylic wart treatment (slower).  Please call the office at [phone] or message [place] if you have have any questions.    MAs please call. Thank you!

## 2022-04-29 ENCOUNTER — Encounter: Payer: Self-pay | Admitting: Dermatology

## 2022-05-08 ENCOUNTER — Telehealth: Payer: Self-pay

## 2022-05-08 NOTE — Telephone Encounter (Signed)
Patient advised bx results showed wart, no tx needed. May tx in office with LN2 or OTC salicylic acid wart treatment at home if recurs. Lurlean Horns., RMA

## 2022-05-08 NOTE — Telephone Encounter (Signed)
-----   Message from [place], MD sent at 04/24/2022  4:54 PM EDT ----- Skin , left medial cheek VERRUCA VULGARIS, IRRITATED  This is a WART caused by the human papilloma virus. It is not dangerous but is contagious and can spread to other areas of skin or other people if it is not completely gone. No additional treatment is needed. However, if it comes back, we can freeze it in clinic with liquid nitrogen (a quick in office procedure) or you can also treat it at home with an over the counter salicylic wart treatment (slower).  Please call the office at [phone] or message [place] if you have have any questions.    MAs please call. Thank you!

## 2022-05-10 IMAGING — CR DG ABDOMEN 1V
2 series · 2 of 2 positions shown · non-contrast
Comparison: 01/11/2021

CLINICAL DATA: Chronic history of kidney stones. History of
lithotripsy on the right 01/11/2021

EXAM:
ABDOMEN - 1 VIEW

[abdomen kub (1 of 2)]
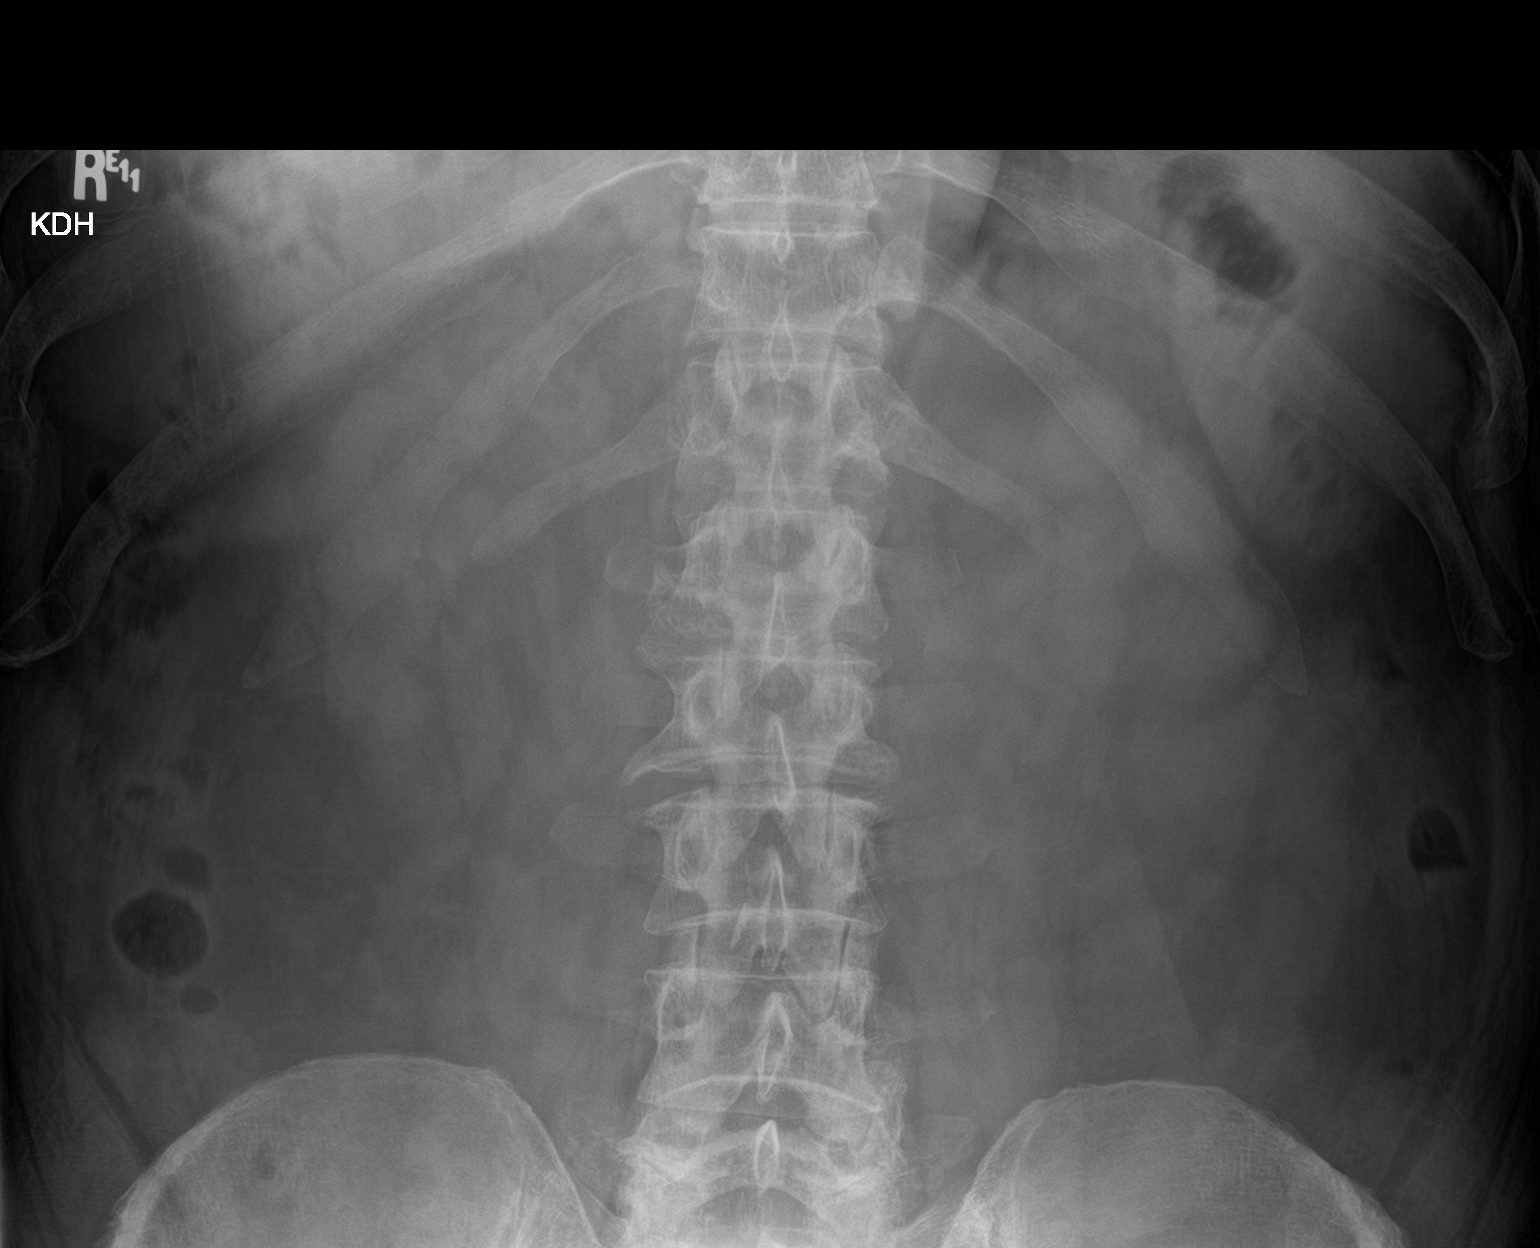

[abdomen kub (2 of 2)]
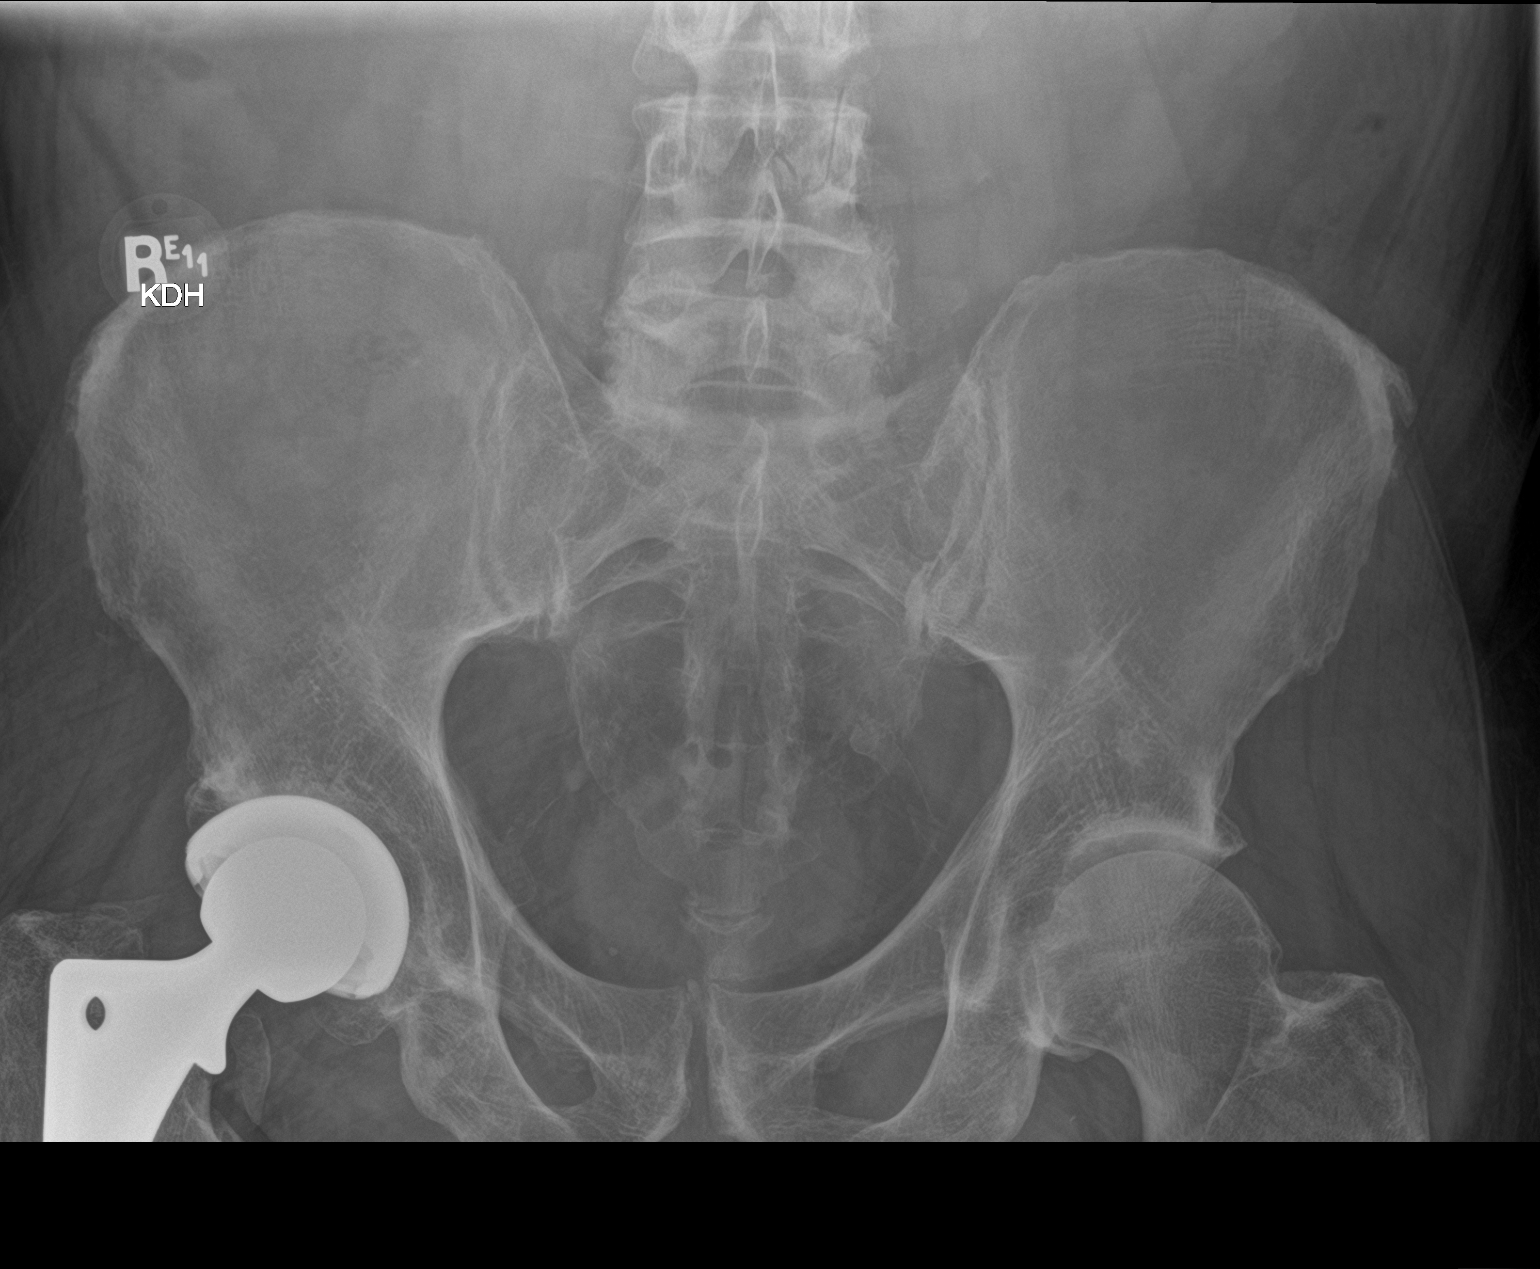

[2 of 2 positions shown; findings below may reference images not displayed]

FINDINGS: Bowel gas pattern is normal. No sign urinary tract calcifications.
As deferens calcification incidentally noted in the pelvis. Mild
lower lumbar degenerative changes.
IMPRESSION: No visible urinary tract stone.

## 2022-08-01 ENCOUNTER — Ambulatory Visit: Payer: Medicare PPO | Admitting: Urology

## 2022-08-01 ENCOUNTER — Encounter: Payer: Self-pay | Admitting: Urology

## 2022-08-01 VITALS — BP 143/90 | HR 75 | Ht 71.0 in

## 2022-08-01 DIAGNOSIS — N138 Other obstructive and reflux uropathy: Secondary | ICD-10-CM

## 2022-08-01 DIAGNOSIS — R350 Frequency of micturition: Secondary | ICD-10-CM

## 2022-08-01 DIAGNOSIS — Z125 Encounter for screening for malignant neoplasm of prostate: Secondary | ICD-10-CM

## 2022-08-01 DIAGNOSIS — N529 Male erectile dysfunction, unspecified: Secondary | ICD-10-CM

## 2022-08-01 DIAGNOSIS — N401 Enlarged prostate with lower urinary tract symptoms: Secondary | ICD-10-CM

## 2022-08-01 LAB — BLADDER SCAN AMB NON-IMAGING

## 2022-08-01 MED ORDER — SILDENAFIL CITRATE 100 MG PO TABS: 100.0000 mg | ORAL_TABLET | Freq: Every day | ORAL | 11 refills | Status: DC | PRN

## 2022-08-01 MED ORDER — TAMSULOSIN HCL 0.4 MG PO CAPS: 0.4000 mg | ORAL_CAPSULE | Freq: Every day | ORAL | 3 refills | Status: DC

## 2022-08-01 NOTE — Addendum Note (Signed)
Addended by: Despina Hidden on: 08/01/2022 10:26 AM   Modules accepted: Orders

## 2022-08-01 NOTE — Progress Notes (Signed)
   08/01/2022 10:21 AM   Arthur Jones [birth year] [id number]  Reason for visit: Follow up BPH, nephrolithiasis, ED  HPI: 71 year old very healthy male here for the above issues.  In terms of his BPH he has a history of urinary retention in 2018 and 1 episode of prostatitis.  His symptoms are currently very well controlled on maximal medical therapy with finasteride and Flomax.  We previously discussed outlet procedures, and he is not bothered enough to consider them at this time.  PSA previously was normal at 0.66 and we opted to discontinue PSA screening per the guideline recommendations.  PSA was recently checked by PCP in May 2023 and was 0.33 (corrected for finasteride 0.66 ) and stable from prior.  PVRs have been normal, and is normal again today at 69 mL.  In terms of his ED, symptoms are currently well controlled on sildenafil as needed.  He also has a history of successful right-sided shockwave lithotripsy for an 8 mm right distal ureteral stone in May 2022, no residual stones on postop KUB.  He had a knee replacement this year.  Overall he is doing well with really no urinary complaints.  Sildenafil continues to work well.  Flomax and finasteride refilled, RTC 1 year PVR Sildenafil refilled   Billey Co, MD  [facility] [address], [place], [place] [address] [phone]

## 2023-01-25 ENCOUNTER — Other Ambulatory Visit: Payer: Self-pay | Admitting: Urology

## 2023-02-06 LAB — COLOGUARD: COLOGUARD: POSITIVE — AB

## 2023-02-06 LAB — EXTERNAL GENERIC LAB PROCEDURE: COLOGUARD: POSITIVE — AB

## 2023-05-13 ENCOUNTER — Encounter: Payer: Self-pay | Admitting: Emergency Medicine

## 2023-05-19 ENCOUNTER — Encounter: Payer: Self-pay | Admitting: *Deleted

## 2023-05-20 ENCOUNTER — Ambulatory Visit: Payer: Medicare PPO | Admitting: Certified Registered"

## 2023-05-20 ENCOUNTER — Encounter: Payer: Self-pay | Admitting: *Deleted

## 2023-05-20 ENCOUNTER — Encounter
Admission: RE | Disposition: A | Discharge status: Home / Self Care | Payer: Self-pay | Source: Home / Self Care | Attending: Gastroenterology

## 2023-05-20 ENCOUNTER — Other Ambulatory Visit: Payer: Self-pay

## 2023-05-20 ENCOUNTER — Ambulatory Visit
Admission: RE | Admit: 2023-05-20 | Discharge: 2023-05-20 | Disposition: A | Discharge status: Home / Self Care | Payer: Medicare PPO | Attending: Gastroenterology | Admitting: Gastroenterology

## 2023-05-20 DIAGNOSIS — D122 Benign neoplasm of ascending colon: Secondary | ICD-10-CM | POA: Insufficient documentation

## 2023-05-20 DIAGNOSIS — Z79899 Other long term (current) drug therapy: Secondary | ICD-10-CM | POA: Diagnosis not present

## 2023-05-20 DIAGNOSIS — D123 Benign neoplasm of transverse colon: Secondary | ICD-10-CM | POA: Diagnosis not present

## 2023-05-20 DIAGNOSIS — D128 Benign neoplasm of rectum: Secondary | ICD-10-CM | POA: Diagnosis not present

## 2023-05-20 DIAGNOSIS — N529 Male erectile dysfunction, unspecified: Secondary | ICD-10-CM | POA: Insufficient documentation

## 2023-05-20 DIAGNOSIS — Z1211 Encounter for screening for malignant neoplasm of colon: Secondary | ICD-10-CM | POA: Diagnosis present

## 2023-05-20 DIAGNOSIS — K640 First degree hemorrhoids: Secondary | ICD-10-CM | POA: Diagnosis not present

## 2023-05-20 DIAGNOSIS — Z96641 Presence of right artificial hip joint: Secondary | ICD-10-CM | POA: Diagnosis not present

## 2023-05-20 DIAGNOSIS — E7800 Pure hypercholesterolemia, unspecified: Secondary | ICD-10-CM | POA: Diagnosis not present

## 2023-05-20 DIAGNOSIS — I10 Essential (primary) hypertension: Secondary | ICD-10-CM | POA: Diagnosis not present

## 2023-05-20 DIAGNOSIS — R195 Other fecal abnormalities: Secondary | ICD-10-CM | POA: Diagnosis not present

## 2023-05-20 HISTORY — PX: COLONOSCOPY WITH PROPOFOL: SHX5780

## 2023-05-20 HISTORY — PX: POLYPECTOMY: SHX5525

## 2023-05-20 SURGERY — COLONOSCOPY WITH PROPOFOL
Anesthesia: General

## 2023-05-20 MED ORDER — PROPOFOL 10 MG/ML IV BOLUS
INTRAVENOUS | Status: DC | PRN
  Administered 2023-05-20: 150 ug/kg/min via INTRAVENOUS
  Administered 2023-05-20: 100 mg via INTRAVENOUS

## 2023-05-20 MED ORDER — LIDOCAINE HCL (CARDIAC) PF 100 MG/5ML IV SOSY
PREFILLED_SYRINGE | INTRAVENOUS | Status: DC | PRN
  Administered 2023-05-20: 100 mg via INTRAVENOUS

## 2023-05-20 MED ORDER — EPHEDRINE SULFATE (PRESSORS) 50 MG/ML IJ SOLN
INTRAMUSCULAR | Status: DC | PRN
  Administered 2023-05-20: 10 mg via INTRAVENOUS

## 2023-05-20 MED ORDER — SODIUM CHLORIDE 0.9 % IV SOLN: INTRAVENOUS | Status: DC

## 2023-05-20 NOTE — Transfer of Care (Signed)
Immediate Anesthesia Transfer of Care Note  Patient: Arthur Jones  Procedure(s) Performed: COLONOSCOPY WITH PROPOFOL POLYPECTOMY  Patient Location: PACU  Anesthesia Type:General  Level of Consciousness: drowsy  Airway & Oxygen Therapy: Patient Spontanous Breathing and Patient connected to nasal cannula oxygen  Post-op Assessment: Report given to RN and Post -op Vital signs reviewed and stable  Post vital signs: stable on RA. Oral airway remains inand pt lying on his left side.  Last Vitals:  Vitals Value Taken Time  BP 116/83 05/20/23 1116  Temp 36 C 05/20/23 1116  Pulse 64 05/20/23 1116  Resp 18 05/20/23 1116  SpO2 94 % 05/20/23 1116  Vitals shown include unfiled device data.  Last Pain:  Vitals:   05/20/23 1116  TempSrc: Temporal  PainSc: Asleep         Complications: No notable events documented.

## 2023-05-20 NOTE — Anesthesia Preprocedure Evaluation (Signed)
Anesthesia Evaluation  Patient identified by MRN, date of birth, ID band Patient awake    Reviewed: Allergy & Precautions, H&P , NPO status , Patient's Chart, lab work & pertinent test results, reviewed documented beta blocker date and time   Airway Mallampati: II   Neck ROM: full    Dental  (+) Poor Dentition, Dental Advidsory Given   Pulmonary neg pulmonary ROS   Pulmonary exam normal        Cardiovascular Exercise Tolerance: Good hypertension, On Medications negative cardio ROS Normal cardiovascular exam Rhythm:regular Rate:Normal     Neuro/Psych  Neuromuscular disease  negative psych ROS   GI/Hepatic negative GI ROS, Neg liver ROS,,,  Endo/Other  negative endocrine ROS    Renal/GU negative Renal ROS  negative genitourinary   Musculoskeletal   Abdominal   Peds  Hematology negative hematology ROS (+)   Anesthesia Other Findings Past Medical History: No date: Erectile dysfunction No date: High cholesterol No date: History of kidney stones No date: Hypertension No date: Urinary retention Past Surgical History: No date: COLONOSCOPY 01/11/2021: EXTRACORPOREAL SHOCK WAVE LITHOTRIPSY; Right     Comment:  Procedure: EXTRACORPOREAL SHOCK WAVE LITHOTRIPSY (ESWL);              Surgeon: Sondra Come, MD;  Location: [facility] ORS;                Service: Urology;  Laterality: Right; No date: NO PAST SURGERIES No date: TONSILLECTOMY     Comment:  as a child 01/18/2019: TOTAL HIP ARTHROPLASTY; Right     Comment:  Procedure: TOTAL HIP ARTHROPLASTY - RIGHT;  Surgeon:               Donato Heinz, MD;  Location: [facility] ORS;  Service:               Orthopedics;  Laterality: Right; BMI    Body Mass Index: 31.80 kg/m     Reproductive/Obstetrics negative OB ROS                             Anesthesia Physical Anesthesia Plan  ASA: 3  Anesthesia Plan: General   Post-op Pain Management: Minimal  or no pain anticipated   Induction: Intravenous  PONV Risk Score and Plan: 2 and Propofol infusion, TIVA and Ondansetron  Airway Management Planned: Nasal Cannula and Natural Airway  Additional Equipment: None  Intra-op Plan:   Post-operative Plan:   Informed Consent: I have reviewed the patients History and Physical, chart, labs and discussed the procedure including the risks, benefits and alternatives for the proposed anesthesia with the patient or authorized representative who has indicated his/her understanding and acceptance.     Dental Advisory Given  Plan Discussed with: CRNA  Anesthesia Plan Comments: (Discussed risks of anesthesia with patient, including possibility of difficulty with spontaneous ventilation under anesthesia necessitating airway intervention, PONV, and rare risks such as cardiac or respiratory or neurological events, and allergic reactions. Discussed the role of CRNA in patient's perioperative care. Patient understands.)        Anesthesia Quick Evaluation

## 2023-05-20 NOTE — Interval H&P Note (Signed)
History and Physical Interval Note:  05/20/2023 10:40 AM  Arthur Jones  has presented today for surgery, with the diagnosis of + Cologuard.  The various methods of treatment have been discussed with the patient and family. After consideration of risks, benefits and other options for treatment, the patient has consented to  Procedure(s): COLONOSCOPY WITH PROPOFOL (N/A) as a surgical intervention.  The patient's history has been reviewed, patient examined, no change in status, stable for surgery.  I have reviewed the patient's chart and labs.  Questions were answered to the patient's satisfaction.     Regis Bill  Ok to proceed with colonoscopy

## 2023-05-20 NOTE — Op Note (Signed)
[facility] Patient Name: Arthur Jones Procedure Date: 05/20/2023 10:42 AM MRN: [id number] Account #: [id number] Date of Birth: May 22, 1951 Admit Type: Outpatient Age: 72 Room: [facility] ENDO ROOM 1 Gender: Male Note Status: Finalized Instrument Name: Prentice Docker [id number] Procedure:             Colonoscopy Indications:           Positive Cologuard test Providers:             Eather Colas MD, MD Referring MD:          Hassell Halim MD (Referring MD) Medicines:             Monitored Anesthesia Care Complications:         No immediate complications. Estimated blood loss:                         Minimal. Procedure:             Pre-Anesthesia Assessment:                        - Prior to the procedure, a History and Physical was                         performed, and patient medications and allergies were                         reviewed. The patient is competent. The risks and                         benefits of the procedure and the sedation options and                         risks were discussed with the patient. All questions                         were answered and informed consent was obtained.                         Patient identification and proposed procedure were                         verified by the physician, the nurse, the                         anesthesiologist, the anesthetist and the technician                         in the endoscopy suite. Mental Status Examination:                         alert and oriented. Airway Examination: normal                         oropharyngeal airway and neck mobility. Respiratory                         Examination: clear to auscultation. CV Examination:  normal. Prophylactic Antibiotics: The patient does not                         require prophylactic antibiotics. Prior                         Anticoagulants: The patient has taken no anticoagulant                          or antiplatelet agents. ASA Grade Assessment: III - A                         patient with severe systemic disease. After reviewing                         the risks and benefits, the patient was deemed in                         satisfactory condition to undergo the procedure. The                         anesthesia plan was to use monitored anesthesia care                         (MAC). Immediately prior to administration of                         medications, the patient was re-assessed for adequacy                         to receive sedatives. The heart rate, respiratory                         rate, oxygen saturations, blood pressure, adequacy of                         pulmonary ventilation, and response to care were                         monitored throughout the procedure. The physical                         status of the patient was re-assessed after the                         procedure.                        After obtaining informed consent, the colonoscope was                         passed under direct vision. Throughout the procedure,                         the patient's blood pressure, pulse, and oxygen                         saturations were monitored continuously. The  Colonoscope was introduced through the anus and                         advanced to the the cecum, identified by appendiceal                         orifice and ileocecal valve. The colonoscopy was                         performed without difficulty. The patient tolerated                         the procedure well. The quality of the bowel                         preparation was adequate to identify polyps. The                         ileocecal valve, appendiceal orifice, and rectum were                         photographed. Findings:      The perianal and digital rectal examinations were normal.      Two sessile polyps were found in the ascending colon. The polyps were 3        to 4 mm in size. These polyps were removed with a cold snare. Resection       and retrieval were complete. Estimated blood loss was minimal.      Two sessile polyps were found in the transverse colon. The polyps were 3       to 4 mm in size. These polyps were removed with a cold snare. Resection       and retrieval were complete. Estimated blood loss was minimal.      A 3 mm polyp was found in the anus. The polyp was sessile. The polyp was       removed with a cold snare. Resection and retrieval were complete.       Estimated blood loss was minimal.      Internal hemorrhoids were found during retroflexion. The hemorrhoids       were Grade I (internal hemorrhoids that do not prolapse).      The exam was otherwise without abnormality on direct and retroflexion       views. Impression:            - Two 3 to 4 mm polyps in the ascending colon, removed                         with a cold snare. Resected and retrieved.                        - Two 3 to 4 mm polyps in the transverse colon,                         removed with a cold snare. Resected and retrieved.                        - One 3 mm polyp at the anus, removed with a cold  snare. Resected and retrieved.                        - Internal hemorrhoids.                        - The examination was otherwise normal on direct and                         retroflexion views. Recommendation:        - Discharge patient to home.                        - Resume previous diet.                        - Continue present medications.                        - Await pathology results.                        - Repeat colonoscopy in 3 years for surveillance.                        - Return to referring physician as previously                         scheduled. Procedure Code(s):     --- Professional ---                        [phone], Colonoscopy, flexible; with removal of                         tumor(s), polyp(s), or other  lesion(s) by snare                         technique Diagnosis Code(s):     --- Professional ---                        D12.2, Benign neoplasm of ascending colon                        D12.3, Benign neoplasm of transverse colon (hepatic                         flexure or splenic flexure)                        K62.0, Anal polyp                        K64.0, First degree hemorrhoids                        R19.5, Other fecal abnormalities CPT copyright 2022 American Medical Association. All rights reserved. The codes documented in this report are preliminary and upon coder review may  be revised to meet current compliance requirements. Eather Colas MD, MD 05/20/2023 11:17:10 AM Number of Addenda: 0 Note Initiated On: 05/20/2023 10:42 AM Scope Withdrawal Time: 0 hours 12 minutes 58 seconds  Total Procedure Duration: 0  hours 16 minutes 54 seconds  Estimated Blood Loss:  Estimated blood loss was minimal.      [facility]

## 2023-05-20 NOTE — H&P (Signed)
Outpatient short stay form Pre-procedure 05/20/2023  Regis Bill, MD  Primary Physician: Kandyce Rud, MD  Reason for visit:  Positive cologuard  History of present illness:    72 y/o gentleman with history of hypertension and HLD here for colonoscopy due to positive cologuard. Last colonoscopy was 10 years ago and per patient was normal. No blood thinners. No family history of GI malignancies. No significant abdominal surgeries.    Current Facility-Administered Medications:    0.9 %  sodium chloride infusion, , Intravenous, Continuous, Erroll Wilbourne, Rossie Muskrat, MD, Last Rate: 20 mL/hr at 05/20/23 1027, New Bag at 05/20/23 1027  Medications Prior to Admission  Medication Sig Dispense Refill Last Dose   amLODipine (NORVASC) 10 MG tablet Take 10 mg by mouth at bedtime.    05/20/2023   acetaminophen (TYLENOL) 500 MG tablet Take 1-2 tablets (500-1,000 mg total) by mouth every 6 (six) hours as needed for mild pain (pain score 1-3 or temp > 100.5). 30 tablet 0    amoxicillin (AMOXIL) 500 MG tablet Take 500 mg by mouth 2 (two) times daily.      celecoxib (CELEBREX) 200 MG capsule Take 1 capsule (200 mg total) by mouth 2 (two) times daily. (Patient not taking: Reported on 05/20/2023) 60 capsule 0 Not Taking   enoxaparin (LOVENOX) 40 MG/0.4ML injection Inject 0.4 mLs (40 mg total) into the skin daily. (Patient not taking: Reported on 05/20/2023) 5.6 mL 0 Not Taking   finasteride (PROSCAR) 5 MG tablet TAKE 1 TABLET BY MOUTH DAILY 90 tablet 3    fluticasone (FLONASE) 50 MCG/ACT nasal spray Place 1 spray into the nose daily as needed for allergies.       mupirocin ointment (BACTROBAN) 2 % Apply to healing wounds QD until healed. 22 g 0    ondansetron (ZOFRAN) 4 MG tablet Take 1 tablet (4 mg total) by mouth every 6 (six) hours as needed for nausea. 30 tablet 0    oxyCODONE (OXY IR/ROXICODONE) 5 MG immediate release tablet Take 1 tablet (5 mg total) by mouth every 4 (four) hours as needed for severe  pain (pain score 4-6). 30 tablet 0    sildenafil (VIAGRA) 100 MG tablet Take 1 tablet (100 mg total) by mouth daily as needed for erectile dysfunction. Take two hours prior to intercourse on an empty stomach 30 tablet 11    simvastatin (ZOCOR) 40 MG tablet Take 40 mg by mouth every evening.       tamsulosin (FLOMAX) 0.4 MG CAPS capsule Take 1 capsule (0.4 mg total) by mouth daily. 90 capsule 3    telmisartan-hydrochlorothiazide (MICARDIS HCT) 80-12.5 MG tablet       traMADol (ULTRAM) 50 MG tablet Take 1-2 tablets (50-100 mg total) by mouth every 4 (four) hours as needed for moderate pain. 30 tablet 0    trolamine salicylate (ASPERCREME) 10 % cream Apply 1 Application topically as needed for muscle pain.        No Known Allergies   Past Medical History:  Diagnosis Date   Erectile dysfunction    High cholesterol    History of kidney stones    Hypertension    Urinary retention     Review of systems:  Otherwise negative.    Physical Exam  Gen: Alert, oriented. Appears stated age.  HEENT: PERRLA. Lungs: No respiratory distress CV: RRR Abd: soft, benign, no masses Ext: No edema    Planned procedures: Proceed with colonoscopy. The patient understands the nature of the planned procedure, indications, risks,  alternatives and potential complications including but not limited to bleeding, infection, perforation, damage to internal organs and possible oversedation/side effects from anesthesia. The patient agrees and gives consent to proceed.  Please refer to procedure notes for findings, recommendations and patient disposition/instructions.     Regis Bill, MD [facility]

## 2023-05-20 NOTE — Anesthesia Postprocedure Evaluation (Signed)
Anesthesia Post Note  Patient: Arthur Jones  Procedure(s) Performed: COLONOSCOPY WITH PROPOFOL POLYPECTOMY  Patient location during evaluation: PACU Anesthesia Type: General Level of consciousness: awake and alert Pain management: pain level controlled Vital Signs Assessment: post-procedure vital signs reviewed and stable Respiratory status: spontaneous breathing, nonlabored ventilation, respiratory function stable and patient connected to nasal cannula oxygen Cardiovascular status: blood pressure returned to baseline and stable Postop Assessment: no apparent nausea or vomiting Anesthetic complications: no  There were no known notable events for this encounter.   Last Vitals:  Vitals:   05/20/23 1006 05/20/23 1116  BP: (!) 159/90 116/83  Pulse: 73 63  Resp: 16 18  Temp: (!) 35.7 C (!) 36 C  SpO2: 98% 94%    Last Pain:  Vitals:   05/20/23 1116  TempSrc: Temporal  PainSc: Asleep                 Stephanie Coup

## 2023-05-21 ENCOUNTER — Encounter: Payer: Self-pay | Admitting: Gastroenterology

## 2023-05-25 LAB — SURGICAL PATHOLOGY

## 2023-06-24 ENCOUNTER — Ambulatory Visit: Payer: Medicare PPO | Admitting: Dermatology

## 2023-06-24 ENCOUNTER — Encounter: Payer: Self-pay | Admitting: Dermatology

## 2023-06-24 DIAGNOSIS — L570 Actinic keratosis: Secondary | ICD-10-CM | POA: Diagnosis not present

## 2023-06-24 DIAGNOSIS — W908XXA Exposure to other nonionizing radiation, initial encounter: Secondary | ICD-10-CM

## 2023-06-24 DIAGNOSIS — L578 Other skin changes due to chronic exposure to nonionizing radiation: Secondary | ICD-10-CM

## 2023-06-24 DIAGNOSIS — D1801 Hemangioma of skin and subcutaneous tissue: Secondary | ICD-10-CM

## 2023-06-24 DIAGNOSIS — Z1283 Encounter for screening for malignant neoplasm of skin: Secondary | ICD-10-CM | POA: Diagnosis not present

## 2023-06-24 DIAGNOSIS — S01501A Unspecified open wound of lip, initial encounter: Secondary | ICD-10-CM

## 2023-06-24 DIAGNOSIS — L814 Other melanin hyperpigmentation: Secondary | ICD-10-CM | POA: Diagnosis not present

## 2023-06-24 DIAGNOSIS — D229 Melanocytic nevi, unspecified: Secondary | ICD-10-CM

## 2023-06-24 DIAGNOSIS — L821 Other seborrheic keratosis: Secondary | ICD-10-CM

## 2023-06-24 NOTE — Progress Notes (Signed)
   Follow-Up Visit   Subjective  Arthur Jones is a 72 y.o. male who presents for the following: Skin Cancer Screening and Full Body Skin Exam  The patient presents for Total-Body Skin Exam (TBSE) for skin cancer screening and mole check. The patient has spots, moles and lesions to be evaluated, some may be new or changing and the patient may have concern these could be cancer.  No hx skin cancer.  The following portions of the chart were reviewed this encounter and updated as appropriate: medications, allergies, medical history  Review of Systems:  No other skin or systemic complaints except as noted in HPI or Assessment and Plan.  Objective  Well appearing patient in no apparent distress; mood and affect are within normal limits.  A full examination was performed including scalp, head, eyes, ears, nose, lips, neck, chest, axillae, abdomen, back, buttocks, bilateral upper extremities, bilateral lower extremities, hands, feet, fingers, toes, fingernails, and toenails. All findings within normal limits unless otherwise noted below.   Relevant physical exam findings are noted in the Assessment and Plan.  left dorsal hand Scaly plaque on left dorsal hand    Assessment & Plan   SKIN CANCER SCREENING PERFORMED TODAY.  ACTINIC DAMAGE - Chronic condition, secondary to cumulative UV/sun exposure - diffuse scaly erythematous macules with underlying dyspigmentation - Recommend daily broad spectrum sunscreen SPF 30+ to sun-exposed areas, reapply every 2 hours as needed.  - Staying in the shade or wearing long sleeves, sun glasses (UVA+UVB protection) and wide brim hats (4-inch brim around the entire circumference of the hat) are also recommended for sun protection.  - Call for new or changing lesions.  LENTIGINES, SEBORRHEIC KERATOSES, HEMANGIOMAS - Benign normal skin lesions - Benign-appearing - Call for any changes  MELANOCYTIC NEVI - Tan-brown and/or pink-flesh-colored symmetric  macules and papules - Benign appearing on exam today - Observation - Call clinic for new or changing moles - Recommend daily use of broad spectrum spf 30+ sunscreen to sun-exposed areas.   ULCERATION Exam: Lower central vermilion lip  Patient to RTC if not resolved in 1 month.    AK (actinic keratosis) left dorsal hand  Actinic keratoses are precancerous spots that appear secondary to cumulative UV radiation exposure/sun exposure over time. They are chronic with expected duration over 1 year. A portion of actinic keratoses will progress to squamous cell carcinoma of the skin. It is not possible to reliably predict which spots will progress to skin cancer and so treatment is recommended to prevent development of skin cancer.  Recommend daily broad spectrum sunscreen SPF 30+ to sun-exposed areas, reapply every 2 hours as needed.  Recommend staying in the shade or wearing long sleeves, sun glasses (UVA+UVB protection) and wide brim hats (4-inch brim around the entire circumference of the hat). Call for new or changing lesions. Recommended cryotherapy. Patient thinks lesion is from trauma and defers treatment. RTC if not resolved in one month   Multiple benign nevi  Lentigines  Actinic elastosis  Seborrheic keratoses  Cherry angioma  Open wound of lip, unspecified open wound type, initial encounter   Return in about 1 year (around 06/23/2024) for TBSE.  Anise Salvo, RMA, am acting as scribe for Elie Goody, MD .   Documentation: I have reviewed the above documentation for accuracy and completeness, and I agree with the above.  Elie Goody, MD

## 2023-06-24 NOTE — Patient Instructions (Signed)

## 2023-07-24 ENCOUNTER — Ambulatory Visit: Payer: Medicare PPO | Admitting: Urology

## 2023-07-24 ENCOUNTER — Encounter: Payer: Self-pay | Admitting: Urology

## 2023-07-24 VITALS — BP 135/90 | HR 67 | Ht 71.0 in | Wt 220.0 lb

## 2023-07-24 DIAGNOSIS — Z125 Encounter for screening for malignant neoplasm of prostate: Secondary | ICD-10-CM

## 2023-07-24 DIAGNOSIS — N401 Enlarged prostate with lower urinary tract symptoms: Secondary | ICD-10-CM

## 2023-07-24 DIAGNOSIS — N529 Male erectile dysfunction, unspecified: Secondary | ICD-10-CM | POA: Diagnosis not present

## 2023-07-24 LAB — BLADDER SCAN AMB NON-IMAGING

## 2023-07-24 MED ORDER — TADALAFIL 20 MG PO TABS: 20.0000 mg | ORAL_TABLET | Freq: Every day | ORAL | 6 refills | Status: DC | PRN

## 2023-07-24 MED ORDER — FINASTERIDE 5 MG PO TABS: 5.0000 mg | ORAL_TABLET | Freq: Every day | ORAL | 3 refills | Status: DC

## 2023-07-24 MED ORDER — TAMSULOSIN HCL 0.4 MG PO CAPS: 0.4000 mg | ORAL_CAPSULE | Freq: Every day | ORAL | 3 refills | Status: DC

## 2023-07-24 NOTE — Patient Instructions (Signed)

## 2023-07-24 NOTE — Progress Notes (Signed)
   07/24/2023 10:20 AM   Arthur Jones December 01, 1950 [id number]  Reason for visit: Follow up BPH, nephrolithiasis, ED, PSA screening  HPI: 72 year old very healthy male here for the above issues.  In terms of his BPH he has a history of urinary retention in 2018 and 1 episode of prostatitis.  His symptoms are currently very well controlled on maximal medical therapy with finasteride and Flomax.  We previously discussed outlet procedures, and he is not bothered enough to consider them at this time.  PVR normal today at 60ml.  PSA has been normal and stable, he continues with yearly PSA screening through PCP.  PSA from May 2024 was stable at 0.51, corrected for finasteride 1.0.  He also has a history of successful right-sided shockwave lithotripsy for an 8 mm right distal ureteral stone in May 2022, no residual stones on postop KUB.  He denies any stone episodes, gross hematuria, or flank pain this year.  Previously has been using sildenafil 100 mg on demand for ED.  He feels this has not been as effective and interested in other options.  I recommended a trial of Cialis 20 mg that could be used either on demand or every other day.  Flomax and finasteride refilled, RTC 1 year PVR Trial of Cialis 20 mg as needed or every other day for ED to replace sildenafil   Sondra Come, MD  [facility] [address], [address] [place], [place] [address] [phone]

## 2023-08-14 ENCOUNTER — Encounter: Payer: Self-pay | Admitting: Dermatology

## 2023-08-14 ENCOUNTER — Ambulatory Visit: Payer: Medicare PPO | Admitting: Dermatology

## 2023-08-14 DIAGNOSIS — L570 Actinic keratosis: Secondary | ICD-10-CM | POA: Diagnosis not present

## 2023-08-14 DIAGNOSIS — W908XXA Exposure to other nonionizing radiation, initial encounter: Secondary | ICD-10-CM | POA: Diagnosis not present

## 2023-08-14 NOTE — Progress Notes (Signed)
   Follow-Up Visit   Subjective  Arthur Jones is a 72 y.o. male who presents for the following: Spot on left hand. Dx as Arnetha Courser 06/24/2023. Deferred treatment at that visit. He states that it has almost gone away completely.   The patient has spots, moles and lesions to be evaluated, some may be new or changing and the patient may have concern these could be cancer.    The following portions of the chart were reviewed this encounter and updated as appropriate: medications, allergies, medical history  Review of Systems:  No other skin or systemic complaints except as noted in HPI or Assessment and Plan.  Objective  Well appearing patient in no apparent distress; mood and affect are within normal limits.  A focused examination was performed of the following areas: Left dorsal hand  Relevant physical exam findings are noted in the Assessment and Plan.  Left Dorsal Hand (2) Erythematous thin papules/macules with gritty scale.   Assessment & Plan   AK (ACTINIC KERATOSIS) (2) Left Dorsal Hand (2) Actinic keratoses are precancerous spots that appear secondary to cumulative UV radiation exposure/sun exposure over time. They are chronic with expected duration over 1 year. A portion of actinic keratoses will progress to squamous cell carcinoma of the skin. It is not possible to reliably predict which spots will progress to skin cancer and so treatment is recommended to prevent development of skin cancer.  Recommend daily broad spectrum sunscreen SPF 30+ to sun-exposed areas, reapply every 2 hours as needed.  Recommend staying in the shade or wearing long sleeves, sun glasses (UVA+UVB protection) and wide brim hats (4-inch brim around the entire circumference of the hat). Call for new or changing lesions.   RTC if not resolved after cryo and healing Destruction of lesion - Left Dorsal Hand (2) Complexity: simple   Destruction method: cryotherapy   Informed consent: discussed and consent  obtained   Timeout:  patient name, date of birth, surgical site, and procedure verified Lesion destroyed using liquid nitrogen: Yes   Region frozen until ice ball extended beyond lesion: Yes   Cryo cycles: 1 or 2. Outcome: patient tolerated procedure well with no complications   Post-procedure details: wound care instructions given   Additional details:  Prior to procedure, discussed risks of blister formation, small wound, skin dyspigmentation, or rare scar following cryotherapy. Recommend Vaseline ointment to treated areas while healing.    Return for TBSE As Scheduled, With Dr. Katrinka Blazing.  I, Lawson Radar, CMA, am acting as scribe for Elie Goody, MD.   Documentation: I have reviewed the above documentation for accuracy and completeness, and I agree with the above.  Elie Goody, MD

## 2023-08-14 NOTE — Patient Instructions (Addendum)

## 2024-04-08 ENCOUNTER — Encounter: Payer: Self-pay | Admitting: Urology

## 2024-06-24 ENCOUNTER — Encounter: Payer: Self-pay | Admitting: Dermatology

## 2024-06-24 ENCOUNTER — Ambulatory Visit (INDEPENDENT_AMBULATORY_CARE_PROVIDER_SITE_OTHER): Payer: Medicare PPO | Admitting: Dermatology

## 2024-06-24 DIAGNOSIS — W908XXA Exposure to other nonionizing radiation, initial encounter: Secondary | ICD-10-CM | POA: Diagnosis not present

## 2024-06-24 DIAGNOSIS — D1801 Hemangioma of skin and subcutaneous tissue: Secondary | ICD-10-CM

## 2024-06-24 DIAGNOSIS — L578 Other skin changes due to chronic exposure to nonionizing radiation: Secondary | ICD-10-CM

## 2024-06-24 DIAGNOSIS — Z1283 Encounter for screening for malignant neoplasm of skin: Secondary | ICD-10-CM | POA: Diagnosis not present

## 2024-06-24 DIAGNOSIS — L821 Other seborrheic keratosis: Secondary | ICD-10-CM

## 2024-06-24 DIAGNOSIS — L814 Other melanin hyperpigmentation: Secondary | ICD-10-CM

## 2024-06-24 DIAGNOSIS — D229 Melanocytic nevi, unspecified: Secondary | ICD-10-CM

## 2024-06-24 DIAGNOSIS — D692 Other nonthrombocytopenic purpura: Secondary | ICD-10-CM

## 2024-06-24 NOTE — Progress Notes (Signed)
   Follow-Up Visit   Subjective  Arthur Jones is a 73 y.o. male who presents for the following: Skin Cancer Screening and Full Body Skin Exam. Hx of AKs. No personal Hx of skin cancer.   The patient presents for Total-Body Skin Exam (TBSE) for skin cancer screening and mole check. The patient has spots, moles and lesions to be evaluated, some may be new or changing and the patient may have concern these could be cancer.    The following portions of the chart were reviewed this encounter and updated as appropriate: medications, allergies, medical history  Review of Systems:  No other skin or systemic complaints except as noted in HPI or Assessment and Plan.  Objective  Well appearing patient in no apparent distress; mood and affect are within normal limits.  A full examination was performed including scalp, head, eyes, ears, nose, lips, neck, chest, axillae, abdomen, back, buttocks, bilateral upper extremities, bilateral lower extremities, hands, feet, fingers, toes, fingernails, and toenails. All findings within normal limits unless otherwise noted below.   Relevant physical exam findings are noted in the Assessment and Plan.    Assessment & Plan   SKIN CANCER SCREENING PERFORMED TODAY.  ACTINIC DAMAGE - Chronic condition, secondary to cumulative UV/sun exposure - diffuse scaly erythematous macules with underlying dyspigmentation - Recommend daily broad spectrum sunscreen SPF 30+ to sun-exposed areas, reapply every 2 hours as needed.  - Staying in the shade or wearing long sleeves, sun glasses (UVA+UVB protection) and wide brim hats (4-inch brim around the entire circumference of the hat) are also recommended for sun protection.  - Call for new or changing lesions.  LENTIGINES, SEBORRHEIC KERATOSES, HEMANGIOMAS - Benign normal skin lesions - Benign-appearing - Call for any changes  MELANOCYTIC NEVI - Tan-brown and/or pink-flesh-colored symmetric macules and papules - Benign  appearing on exam today - Observation - Call clinic for new or changing moles - Recommend daily use of broad spectrum spf 30+ sunscreen to sun-exposed areas.    Purpura - Chronic; persistent and recurrent.  Treatable, but not curable. - Violaceous macules and patches at hands and arms - Benign - Related to trauma, age, sun damage and/or use of blood thinners, chronic use of topical and/or oral steroids - Observe - Can use OTC arnica containing moisturizer such as Dermend Bruise Formula if desired - Call for worsening or other concerns    MULTIPLE BENIGN NEVI   LENTIGINES   ACTINIC ELASTOSIS   SEBORRHEIC KERATOSES   CHERRY ANGIOMA   SOLAR PURPURA   Return in about 1 year (around 06/24/2025) for TBSE, HxAKs.  I, Jill Parcell, CMA, am acting as scribe for Boneta Sharps, MD.   Documentation: I have reviewed the above documentation for accuracy and completeness, and I agree with the above.  Boneta Sharps, MD

## 2024-06-24 NOTE — Patient Instructions (Signed)

## 2024-08-04 ENCOUNTER — Ambulatory Visit: Admitting: Urology

## 2024-08-04 VITALS — BP 176/78 | HR 83 | Ht 71.0 in | Wt 215.0 lb

## 2024-08-04 DIAGNOSIS — Z125 Encounter for screening for malignant neoplasm of prostate: Secondary | ICD-10-CM | POA: Diagnosis not present

## 2024-08-04 DIAGNOSIS — N401 Enlarged prostate with lower urinary tract symptoms: Secondary | ICD-10-CM

## 2024-08-04 DIAGNOSIS — N529 Male erectile dysfunction, unspecified: Secondary | ICD-10-CM | POA: Diagnosis not present

## 2024-08-04 DIAGNOSIS — N138 Other obstructive and reflux uropathy: Secondary | ICD-10-CM | POA: Diagnosis not present

## 2024-08-04 DIAGNOSIS — N200 Calculus of kidney: Secondary | ICD-10-CM | POA: Diagnosis not present

## 2024-08-04 LAB — BLADDER SCAN AMB NON-IMAGING

## 2024-08-04 MED ORDER — FINASTERIDE 5 MG PO TABS: 5.0000 mg | ORAL_TABLET | Freq: Every day | ORAL | 3 refills | Status: AC

## 2024-08-04 MED ORDER — TADALAFIL 20 MG PO TABS: 20.0000 mg | ORAL_TABLET | Freq: Every day | ORAL | 6 refills | Status: AC | PRN

## 2024-08-04 MED ORDER — TAMSULOSIN HCL 0.4 MG PO CAPS: 0.4000 mg | ORAL_CAPSULE | Freq: Every day | ORAL | 3 refills | Status: AC

## 2024-08-04 NOTE — Progress Notes (Signed)
   08/04/2024 3:51 PM   Arthur Jones Slack 1950-10-02 [id number]  Reason for visit: Follow up BPH, nephrolithiasis, ED, PSA screening  History: BPH well-controlled on maximal medical therapy ED well-controlled on Cialis  after changing from sildenafil  in 2024 PSA has been below 1 History of nephrolithiasis with successful shockwave lithotripsy in 2022  Physical Exam: BP (!) 176/78 (BP Location: Left Arm, Patient Position: Sitting, Cuff Size: Normal)   Pulse 83   Ht 5' 11 (1.803 m)   Wt 215 lb (97.5 kg)   SpO2 97%   BMI 29.99 kg/m    Imaging/labs: PSA May 2025 0.44, corrected for finasteride  0.9  Today: Doing well over the last year, really no urologic complaints today No voiding symptoms, PVR today but has been an hour or 2 since he voided and no urge to void Cialis  improved over the sildenafil  Remains very active with tennis and bike riding  Plan:   BPH: Continue Flomax  and finasteride , refilled Nephrolithiasis: No stone episodes in the last year, will repeat KUB next year ED: Improved on Cialis  over sildenafil , refilled PSA screening: Remains normal and below 1, can continue screening with his excellent health Meds refilled, RTC 1 year PVR, KUB prior   Arthur Jones Burnet, MD  [facility] [address], [address] [place], [place] [address] [phone]

## 2024-08-05 ENCOUNTER — Ambulatory Visit

## 2024-08-05 ENCOUNTER — Ambulatory Visit: Payer: Self-pay | Admitting: Urology

## 2024-08-05 ENCOUNTER — Other Ambulatory Visit: Payer: Self-pay

## 2024-08-05 DIAGNOSIS — L02212 Cutaneous abscess of back [any part, except buttock]: Secondary | ICD-10-CM

## 2024-08-05 DIAGNOSIS — L0291 Cutaneous abscess, unspecified: Secondary | ICD-10-CM

## 2024-08-05 MED ORDER — DOXYCYCLINE MONOHYDRATE 100 MG PO TABS: ORAL_TABLET | ORAL | 0 refills | Status: AC

## 2024-08-05 NOTE — Patient Instructions (Signed)

## 2024-08-05 NOTE — Progress Notes (Signed)
    Subjective   Arthur Jones is a 73 y.o. male who presents for the following: Patient c/o a cyst on his back appeared a few days ago, red and tender, patient would like this cyst drained today.  Patient has a hx of cyst on his back. Patient is established patient     Review of Systems:    No other skin or systemic complaints except as noted in HPI or Assessment and Plan.  The following portions of the chart were reviewed this encounter and updated as appropriate: medications, allergies, medical history  Relevant Medical History:  Reviewed   Objective  (SKPE) Well appearing patient in no apparent distress; mood and affect are within normal limits. Examination was performed of the: Focused Exam of: back   Examination notable for: below   Right Upper Back 3.0 cm inflamed abscess on the right upper back  Assessment & Plan  (SKAP)    Abscess R upper back  -  Incision & Drainage: Area prepped and anesthetized. Incision made with 4mm punch  and purulent material expressed. Loculations broken up with hemostat. Abscess cavity irrigated. Patient tolerated procedure well without complication.  Culture: Wound culture obtained and sent for aerobic/anaerobic analysis to guide targeted antibiotic therapy.  Antibiotics: Started on oral antibiotics. Discussed dosing, duration, and precautions.  Wound Care: Instructed on warm compresses, dressing changes, and signs of worsening infection (increasing redness, pain, fever, streaking, expanding swelling).  Follow-up: Return sooner for worsening symptoms; otherwise follow up when culture results return or if no improvement in 48-72 hours. - Start doxycycline  100 bid x 10 days  - Discussed side effects and precautions with doxycycline  including taking with meal, waiting at least 30 minutes before lying down at night, increased sun sensitivity, and to stop medication if becomes pregnant or breastfeeding   Procedures, orders, diagnosis for this  visit:  ABSCESS Right Upper Back Incision and Drainage - Right Upper Back Location: right upper back  Informed Consent: Discussed risks (permanent scarring, light or dark discoloration, infection, pain, bleeding, bruising, redness, damage to adjacent structures, and recurrence of the lesion) and benefits of the procedure, as well as the alternatives.  Informed consent was obtained.  Preparation: The area was prepped with alcohol.  Anesthesia: Lidocaine  2% with epinephrine  Procedure Details: An incision was made overlying the lesion. The lesion drained blood.  A large amount of purulent fluid was drained.    Antibiotic ointment and a sterile pressure dressing were applied. The patient tolerated procedure well.  Total number of lesions drained: 1  Plan: The patient was instructed on post-op care. Recommend OTC analgesia as needed for pain.   Related Procedures Aerobic culture  Abscess -     Aerobic culture -     Incision and Drainage  Other orders -     Doxycycline  Monohydrate; Take 100 mg twice daily for 10 days  Dispense: 20 tablet; Refill: 0   Level of service outlined above   Return to clinic: Return if symptoms worsen or fail to improve.  IFay Kirks, CMA, am acting as scribe for Lauraine JAYSON Kanaris, MD .   Documentation: I have reviewed the above documentation for accuracy and completeness, and I agree with the above.  Lauraine JAYSON Kanaris, MD

## 2024-08-11 ENCOUNTER — Ambulatory Visit: Payer: Self-pay

## 2024-08-11 ENCOUNTER — Telehealth: Payer: Self-pay

## 2024-08-11 LAB — SPECIMEN STATUS REPORT

## 2024-08-11 LAB — AEROBIC CULTURE

## 2024-08-11 NOTE — Progress Notes (Signed)
 LMTRC

## 2024-08-11 NOTE — Telephone Encounter (Signed)
 Patient called left message on VM, cyst drained here last week is still draining.   I called patient ask if he could send over a mychart picture of the drainage. Patient will try to send a mychart picture

## 2024-08-11 NOTE — Telephone Encounter (Signed)
 Patient called LM on VM

## 2025-06-27 ENCOUNTER — Encounter: Admitting: Dermatology

## 2025-08-04 ENCOUNTER — Ambulatory Visit: Admitting: Urology
